# Patient Record
Sex: Female | Born: 1970 | Race: White | Hispanic: No | Marital: Married | Smoking: Former smoker
Health system: Southern US, Community
[De-identification: ages and names within clinical notes are randomized; demographics above are authoritative.]

## PROBLEM LIST (undated history)

## (undated) DIAGNOSIS — R7309 Other abnormal glucose: Secondary | ICD-10-CM

## (undated) DIAGNOSIS — IMO0002 Reserved for concepts with insufficient information to code with codable children: Secondary | ICD-10-CM

## (undated) DIAGNOSIS — M359 Systemic involvement of connective tissue, unspecified: Secondary | ICD-10-CM

## (undated) DIAGNOSIS — M2550 Pain in unspecified joint: Secondary | ICD-10-CM

## (undated) DIAGNOSIS — M62830 Muscle spasm of back: Secondary | ICD-10-CM

## (undated) DIAGNOSIS — F419 Anxiety disorder, unspecified: Secondary | ICD-10-CM

## (undated) DIAGNOSIS — K219 Gastro-esophageal reflux disease without esophagitis: Secondary | ICD-10-CM

## (undated) DIAGNOSIS — K760 Fatty (change of) liver, not elsewhere classified: Secondary | ICD-10-CM

## (undated) DIAGNOSIS — M7072 Other bursitis of hip, left hip: Secondary | ICD-10-CM

## (undated) DIAGNOSIS — F32A Depression, unspecified: Secondary | ICD-10-CM

## (undated) DIAGNOSIS — K449 Diaphragmatic hernia without obstruction or gangrene: Secondary | ICD-10-CM

## (undated) DIAGNOSIS — H40059 Ocular hypertension, unspecified eye: Secondary | ICD-10-CM

## (undated) DIAGNOSIS — M797 Fibromyalgia: Secondary | ICD-10-CM

## (undated) DIAGNOSIS — F329 Major depressive disorder, single episode, unspecified: Secondary | ICD-10-CM

## (undated) DIAGNOSIS — M7071 Other bursitis of hip, right hip: Secondary | ICD-10-CM

## (undated) DIAGNOSIS — L940 Localized scleroderma [morphea]: Secondary | ICD-10-CM

## (undated) DIAGNOSIS — M329 Systemic lupus erythematosus, unspecified: Secondary | ICD-10-CM

## (undated) DIAGNOSIS — J45909 Unspecified asthma, uncomplicated: Secondary | ICD-10-CM

## (undated) DIAGNOSIS — M722 Plantar fascial fibromatosis: Secondary | ICD-10-CM

## (undated) DIAGNOSIS — M3500 Sicca syndrome, unspecified: Secondary | ICD-10-CM

## (undated) DIAGNOSIS — G4730 Sleep apnea, unspecified: Secondary | ICD-10-CM

## (undated) HISTORY — PX: SALPINGECTOMY: SHX328

## (undated) HISTORY — PX: WISDOM TOOTH EXTRACTION: SHX21

## (undated) HISTORY — DX: Ocular hypertension, unspecified eye: H40.059

## (undated) HISTORY — DX: Other bursitis of hip, left hip: M70.72

## (undated) HISTORY — DX: Major depressive disorder, single episode, unspecified: F32.9

## (undated) HISTORY — DX: Other bursitis of hip, right hip: M70.71

## (undated) HISTORY — DX: Diaphragmatic hernia without obstruction or gangrene: K44.9

## (undated) HISTORY — DX: Sjogren syndrome, unspecified: M35.00

## (undated) HISTORY — DX: Depression, unspecified: F32.A

## (undated) HISTORY — DX: Plantar fascial fibromatosis: M72.2

## (undated) HISTORY — DX: Fibromyalgia: M79.7

## (undated) HISTORY — DX: Sleep apnea, unspecified: G47.30

---

## 1992-02-13 HISTORY — PX: APPENDECTOMY: SHX54

## 2001-09-29 ENCOUNTER — Emergency Department (HOSPITAL_COMMUNITY): Admission: EM | Admit: 2001-09-29 | Discharge: 2001-09-29 | Payer: Self-pay | Admitting: Emergency Medicine

## 2001-09-29 ENCOUNTER — Encounter: Payer: Self-pay | Admitting: Emergency Medicine

## 2002-03-17 ENCOUNTER — Encounter: Admission: RE | Admit: 2002-03-17 | Discharge: 2002-03-17 | Payer: Self-pay | Admitting: Emergency Medicine

## 2002-03-17 ENCOUNTER — Encounter: Payer: Self-pay | Admitting: Emergency Medicine

## 2003-06-13 ENCOUNTER — Emergency Department (HOSPITAL_COMMUNITY): Admission: EM | Admit: 2003-06-13 | Discharge: 2003-06-13 | Payer: Self-pay | Admitting: Emergency Medicine

## 2004-06-07 ENCOUNTER — Other Ambulatory Visit: Admission: RE | Admit: 2004-06-07 | Discharge: 2004-06-07 | Payer: Self-pay | Admitting: Gynecology

## 2007-09-01 ENCOUNTER — Emergency Department (HOSPITAL_COMMUNITY): Admission: EM | Admit: 2007-09-01 | Discharge: 2007-09-01 | Payer: Self-pay | Admitting: Emergency Medicine

## 2007-10-30 ENCOUNTER — Emergency Department (HOSPITAL_COMMUNITY): Admission: EM | Admit: 2007-10-30 | Discharge: 2007-10-30 | Payer: Self-pay | Admitting: Emergency Medicine

## 2009-09-21 ENCOUNTER — Encounter (INDEPENDENT_AMBULATORY_CARE_PROVIDER_SITE_OTHER): Payer: Self-pay | Admitting: *Deleted

## 2009-09-21 ENCOUNTER — Ambulatory Visit (HOSPITAL_COMMUNITY): Admission: RE | Admit: 2009-09-21 | Discharge: 2009-09-21 | Payer: Self-pay | Admitting: Family Medicine

## 2009-10-04 ENCOUNTER — Encounter (INDEPENDENT_AMBULATORY_CARE_PROVIDER_SITE_OTHER): Payer: Self-pay | Admitting: *Deleted

## 2009-10-04 ENCOUNTER — Ambulatory Visit: Payer: Self-pay | Admitting: Gastroenterology

## 2009-10-04 DIAGNOSIS — F419 Anxiety disorder, unspecified: Secondary | ICD-10-CM

## 2009-10-04 DIAGNOSIS — R1013 Epigastric pain: Secondary | ICD-10-CM | POA: Insufficient documentation

## 2009-10-04 LAB — CONVERTED CEMR LAB
ALT: 22 units/L (ref 0–35)
BUN: 10 mg/dL (ref 6–23)
Basophils Relative: 1.2 % (ref 0.0–3.0)
CO2: 26 meq/L (ref 19–32)
Chloride: 107 meq/L (ref 96–112)
Creatinine, Ser: 0.7 mg/dL (ref 0.4–1.2)
Eosinophils Absolute: 0.1 10*3/uL (ref 0.0–0.7)
HCT: 41.2 % (ref 36.0–46.0)
Hemoglobin: 14.2 g/dL (ref 12.0–15.0)
IgA: 211 mg/dL (ref 68–378)
Lymphocytes Relative: 22 % (ref 12.0–46.0)
MCHC: 34.5 g/dL (ref 30.0–36.0)
Neutro Abs: 5.2 10*3/uL (ref 1.4–7.7)
RBC: 4.55 M/uL (ref 3.87–5.11)
Sed Rate: 10 mm/hr (ref 0–22)
TSH: 4.06 microintl units/mL (ref 0.35–5.50)
Tissue Transglutaminase Ab, IgA: 13.8 units (ref <20)
Total Protein: 6.8 g/dL (ref 6.0–8.3)
Vitamin B-12: 267 pg/mL (ref 211–911)

## 2009-10-06 ENCOUNTER — Telehealth: Payer: Self-pay | Admitting: Gastroenterology

## 2009-10-26 ENCOUNTER — Encounter: Admission: RE | Admit: 2009-10-26 | Discharge: 2009-10-26 | Payer: Self-pay | Admitting: Gynecology

## 2010-01-10 ENCOUNTER — Emergency Department (HOSPITAL_COMMUNITY)
Admission: EM | Admit: 2010-01-10 | Discharge: 2010-01-11 | Payer: Self-pay | Source: Home / Self Care | Admitting: Emergency Medicine

## 2010-01-31 ENCOUNTER — Encounter
Admission: RE | Admit: 2010-01-31 | Discharge: 2010-01-31 | Payer: Self-pay | Source: Home / Self Care | Attending: Sports Medicine | Admitting: Sports Medicine

## 2010-03-14 NOTE — Assessment & Plan Note (Signed)
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 She had been under extreme amounts of personal stress which seemed to have exacerbated her problems. She also has a globus sensation in her throat and chronic back pain. She had been on PPI therapy without improvement. She denies true dysphagia, emesis, change in bowel habits, melena or hematochezia.  She apparently also has had vague lower, we'll discomfort prompting emergent gynecologic evaluation and ultrasound. Apparently these exams were normal. She is status post bilateral tubal ligations. She also has had appendectomy. Before she became sick 2-3 weeks ago she was using large amount of NSAIDs for various problems. She denies abuse of alcohol or cigarettes.  She's had no hepatobiliary complaints, change in bowel habits, fever, chills, or weight loss. Surprisingly her abdominal pain is not related to eating. Her pain is described as a stabbing pain which lasted a few minutes in duration and does not awaken her from sleep. She denies true dyspepsia. She has not had previous endoscopy or barium studies, but recent upper abdominal ultrasound exam was normal.   GI Review of Systems    Reports abdominal pain, acid reflux, bloating, loss of appetite, and  nausea.     Location of  Abdominal pain: generalized.    Denies belching, chest pain, dysphagia with liquids, dysphagia with solids, heartburn, vomiting, vomiting blood, weight loss, and  weight gain.        Denies  anal fissure, black tarry stools, change in bowel habit, constipation, diarrhea, diverticulosis, fecal incontinence, heme positive stool, hemorrhoids, irritable bowel syndrome, jaundice, light color stool, liver problems, rectal bleeding, and  rectal pain. Preventive Screening-Counseling & Management      Drug Use:  no.      Current Medications (verified): 1)  Flonase 50 Mcg/act Susp (Fluticasone Propionate) .... 2 Sprays Q Am 2)  Loratadine 10 Mg Tabs (Loratadine) .... Qd 3)  Proair Hfa 108 (90 Base) Mcg/act Aers (Albuterol Sulfate) .... As Needed 4)  Dexilant 60 Mg Cpdr (Dexlansoprazole) .... Once Daily  Allergies: 1)  ! Codeine 2)  ! Penicillin 3)  ! * Avelox 4)  ! Compazine 5)  ! * Midrin  Past History:  Past medical, surgical, family and social histories (including risk factors) reviewed for relevance to current acute and chronic problems.  Past Medical History: Reviewed history from 10/03/2009 and no changes required. Anxiety Disorder GERD  Past Surgical History: Appendectomy Salpenectomy  Family History: Reviewed history and no changes required. Family History of Diabetes:  Family History of Heart Disease:  Family History of Kidney Disease: No FH of Colon Cancer:  Social History: Reviewed history from 10/03/2009 and no changes required. Patient has never smoked.  Alcohol Use - no Illicit Drug Use - no Occupation: CMA Daily Caffeine Use  Review of Systems       The patient complains of anxiety-new, back pain, confusion, depression-new, fatigue, headaches-new, muscle pains/cramps, and swelling of feet/legs.  The patient denies allergy/sinus, anemia, arthritis/joint pain, blood in urine, breast changes/lumps, change in vision, cough, coughing up blood, fainting, fever, hearing problems, heart murmur, heart rhythm changes,  itching, menstrual pain, night sweats, nosebleeds, pregnancy symptoms, shortness of breath, skin rash, sleeping problems, sore throat, swollen  lymph glands, thirst - excessive , urination - excessive , urination changes/pain, urine leakage, vision changes, and voice change.    Vital Signs:  Patient profile:   40 year old female Height:      66 inches Weight:      179.13 pounds BMI:     29.02 Pulse rate:   72 / minute Pulse rhythm:   regular BP sitting:   102 / 70  (left arm) Cuff size:   regular  Vitals Entered By: June McMurray CMA Duncan Dull) (October 04, 2009 8:28 AM)  Physical Exam  General:  Well developed, well nourished, no acute distress.healthy appearing.   Head:  Normocephalic and atraumatic. Eyes:  PERRLA, no icterus.exam deferred to patient's ophthalmologist.   Neck:  Supple; no masses or thyromegaly. Lungs:  Clear throughout to auscultation. Heart:  Regular rate and rhythm; no murmurs, rubs,  or bruits. Abdomen:  Soft, nontender and nondistended. No masses, hepatosplenomegaly or hernias noted. Normal bowel sounds. Msk:  Symmetrical with no gross deformities. Normal posture. Pulses:  Normal pulses noted. Extremities:  No clubbing, cyanosis, edema or deformities noted. Neurologic:  Alert and  oriented x4;  grossly normal neurologically. Skin:  Intact without significant lesions or rashes. Cervical Nodes:  No significant cervical adenopathy. Psych:  Alert and cooperative. Normal mood and affect.   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 pylori infection. I placed her on Librax t.i.d., continue daily PPI, check labs, and we'll proceed with endoscopic exam.She also is to avoid NSAIDs if possible. Orders: TLB-CBC Platelet - w/Differential (85025-CBCD) TLB-BMP (Basic Metabolic Panel-BMET) (80048-METABOL) TLB-Hepatic/Liver Function Pnl (80076-HEPATIC) TLB-TSH (Thyroid Stimulating Hormone) (84443-TSH) TLB-B12, Serum-Total ONLY (66063-K16) TLB-Ferritin  (82728-FER) TLB-Folic Acid (Folate) (82746-FOL) TLB-IBC Pnl (Iron/FE;Transferrin) (83550-IBC) TLB-CRP-High Sensitivity (C-Reactive Protein) (86140-FCRP) TLB-Sedimentation Rate (ESR) (85652-ESR) TLB-IgA (Immunoglobulin A) (82784-IGA) T-Sprue Panel (Celiac Disease Aby Eval) (83516x3/[address]) EGD (EGD)  Problem # 2:  ANXIETY (ICD-300.00) Assessment: Deteriorated Recent worsening of her anxiety with her grandmother dying and her daughter leaving home. She previously had been on Wellbutrin and may need reinstitution of antidepressants.  Patient Instructions: 1)  Please go to the basement for lab work. 2)  Begin Librax.tid.Marland Kitchenac 3)  You are scheduled for an upper endoscopy. 4)  The medication list was reviewed and reconciled.  All changed / newly prescribed medications were explained.  A complete medication list was provided to the patient / caregiver. 5)  Copy sent to : Dr. Holley Bouche 6)  Continue daily PPI use. Prescriptions: CLIDINIUM-CHLORDIAZEPOXIDE 2.5-5 MG CAPS (CLIDINIUM-CHLORDIAZEPOXIDE) 1 by mouth three times a day  #90 x 3   Entered by:   Ashok Cordia RN   Authorized by:   Mardella Layman MD [facility]   Signed by:   Ashok Cordia RN on 10/04/2009   Method used:   Electronically to        [facility] # [phone]* (retail)       [address]       [place], [place]  [address]       Ph: [phone]       Fax: [phone]   RxID:   [id number]

## 2010-03-14 NOTE — Progress Notes (Signed)
Summary: results request  Phone Note Call from Patient Call back at Work Phone [phone]   Caller: Patient Call For: Dr. Jarold Motto Reason for Call: Talk to Nurse Summary of Call: would like labwork results Initial call taken by: Vallarie Mare,  October 06, 2009 10:10 AM  Follow-up for Phone Call        Pt notified labs normal. Follow-up by: Ashok Cordia RN,  October 06, 2009 12:50 PM

## 2010-03-14 NOTE — Letter (Signed)
Summary: EGD Instructions  [facility]  [address] [place], [place] [address]   Phone: [phone]  Fax: [phone]       Allison Davies    05-05-1970    MRN: [id number]       Procedure Day /Date: Friday, 10/21/09     Arrival Time:  2:30     Procedure Time: 3:30X     Location of Procedure:                    [facility] (4th Floor)    PREPARATION FOR ENDOSCOPY   On 10/21/09 THE DAY OF THE PROCEDURE:  1.   No solid foods, milk or milk products are allowed after midnight the night before your procedure.  2.   Do not drink anything colored red or purple.  Avoid juices with pulp.  No orange juice.  3.  You may drink clear liquids until 1:30 pm, which is 2 hours before your procedure.                                                                                                CLEAR LIQUIDS INCLUDE: Water Jello Ice Popsicles Tea (sugar ok, no milk/cream) Powdered fruit flavored drinks Coffee (sugar ok, no milk/cream) Gatorade Juice: apple, white grape, white cranberry  Lemonade Clear bullion, consomm, broth Carbonated beverages (any kind) Strained chicken noodle soup Hard Candy   MEDICATION INSTRUCTIONS  Unless otherwise instructed, you should take regular prescription medications with a small sip of water as early as possible the morning of your procedure.                   OTHER INSTRUCTIONS  You will need a responsible adult at least 41 years of age to accompany you and drive you home.   This person must remain in the waiting room during your procedure.  Wear loose fitting clothing that is easily removed.  Leave jewelry and other valuables at home.  However, you may wish to bring a book to read or an iPod/MP3 player to listen to music as you wait for your procedure to start.  Remove all body piercing jewelry and leave at home.  Total time from sign-in until discharge is approximately 2-3 hours.  You should go  home directly after your procedure and rest.  You can resume normal activities the day after your procedure.  The day of your procedure you should not:   Drive   Make legal decisions   Operate machinery   Drink alcohol   Return to work  You will receive specific instructions about eating, activities and medications before you leave.    The above instructions have been reviewed and explained to me by   _______________________    I fully understand and can verbalize these instructions _____________________________ Date _________

## 2010-04-25 LAB — URINALYSIS, ROUTINE W REFLEX MICROSCOPIC
Glucose, UA: 100 mg/dL — AB
Nitrite: NEGATIVE
Protein, ur: NEGATIVE mg/dL

## 2010-04-25 LAB — CBC
Hemoglobin: 11.3 g/dL — ABNORMAL LOW (ref 12.0–15.0)
MCHC: 33.3 g/dL (ref 30.0–36.0)
RBC: 3.67 MIL/uL — ABNORMAL LOW (ref 3.87–5.11)

## 2010-04-25 LAB — URINE MICROSCOPIC-ADD ON

## 2010-04-25 LAB — DIFFERENTIAL
Basophils Relative: 0 % (ref 0–1)
Lymphs Abs: 1.1 10*3/uL (ref 0.7–4.0)
Monocytes Relative: 4 % (ref 3–12)
Neutro Abs: 6.5 10*3/uL (ref 1.7–7.7)
Neutrophils Relative %: 81 % — ABNORMAL HIGH (ref 43–77)

## 2010-04-25 LAB — BASIC METABOLIC PANEL
Calcium: 8.3 mg/dL — ABNORMAL LOW (ref 8.4–10.5)
GFR calc Af Amer: >60 mL/min (ref >60)
GFR calc non Af Amer: >60 mL/min (ref >60)
Sodium: 137 mEq/L (ref 135–145)

## 2010-09-08 ENCOUNTER — Other Ambulatory Visit: Payer: Self-pay | Admitting: Gynecology

## 2010-09-08 DIAGNOSIS — R920 Mammographic microcalcification found on diagnostic imaging of breast: Secondary | ICD-10-CM

## 2010-10-23 ENCOUNTER — Ambulatory Visit
Admission: RE | Admit: 2010-10-23 | Discharge: 2010-10-23 | Disposition: A | Discharge status: Home / Self Care | Payer: No Typology Code available for payment source | Source: Ambulatory Visit | Attending: Gynecology | Admitting: Gynecology

## 2010-10-23 DIAGNOSIS — R920 Mammographic microcalcification found on diagnostic imaging of breast: Secondary | ICD-10-CM

## 2010-11-10 LAB — DIFFERENTIAL
Basophils Absolute: 0
Basophils Relative: 1
Eosinophils Absolute: 0.1
Eosinophils Relative: 1
Neutrophils Relative %: 67

## 2010-11-10 LAB — CBC
HCT: 40.2
MCV: 92.6
Platelets: 200
RDW: 13.8

## 2010-11-10 LAB — POCT CARDIAC MARKERS
Myoglobin, poc: 65.6
Operator id: 264031

## 2010-11-10 LAB — POCT I-STAT, CHEM 8
Creatinine, Ser: 0.8
HCT: 42
Hemoglobin: 14.3
Sodium: 142
TCO2: 20

## 2010-11-13 LAB — POCT I-STAT, CHEM 8
Calcium, Ion: 1.23
Glucose, Bld: 64 — ABNORMAL LOW
HCT: 39
Hemoglobin: 13.3
Potassium: 3.6

## 2010-11-13 LAB — CBC
MCHC: 34.1
MCV: 94.1
RBC: 4.18
RDW: 13.3

## 2010-11-13 LAB — DIFFERENTIAL
Basophils Relative: 1
Eosinophils Absolute: 0.1
Monocytes Relative: 5
Neutrophils Relative %: 66

## 2011-03-28 DIAGNOSIS — K760 Fatty (change of) liver, not elsewhere classified: Secondary | ICD-10-CM | POA: Insufficient documentation

## 2011-10-10 ENCOUNTER — Telehealth: Payer: Self-pay

## 2011-10-10 NOTE — Telephone Encounter (Signed)
LMOM to CB. Trying to fill out form for prior auth of Wellbutrin XL. Form and chart are at [employer]. Please verify with pt if she is still taking 150 mg QD?

## 2011-10-11 NOTE — Telephone Encounter (Signed)
I have spoken to her, she does not take this medication and she does not need it precerted, told us to disregard this.

## 2011-10-11 NOTE — Telephone Encounter (Signed)
Pt had tried to CB and LM on nurse VM. Tried to call pt at # she left [phone], and had to LM for CB again.

## 2011-11-27 DIAGNOSIS — R609 Edema, unspecified: Secondary | ICD-10-CM | POA: Insufficient documentation

## 2011-11-27 DIAGNOSIS — M545 Low back pain, unspecified: Secondary | ICD-10-CM | POA: Insufficient documentation

## 2011-12-06 ENCOUNTER — Telehealth: Payer: Self-pay | Admitting: Gastroenterology

## 2011-12-06 NOTE — Telephone Encounter (Signed)
lmom for pt to call back. Pt's EGD was scheduled for 10/04/09; I can find no other notes.

## 2011-12-06 NOTE — Telephone Encounter (Signed)
Pt reports she has Lupus and has been taking Physical Therapy for Lupus. She also has been taking Ibuprofen for the associated joint and muscle pain. She vomited BRB on 12/01/11 and saw her PCP and he stopped the Ibuprofen and stated she needs an EGD. She also reports pain below her breat bone. She was on Omeprazole, but Dr Alois Cliche changed her to Ranitidine bid. Pt will see Willette Cluster, NP on 12/11/11. Received info from [place] and it was given to [facility], CMA.

## 2011-12-10 ENCOUNTER — Ambulatory Visit (INDEPENDENT_AMBULATORY_CARE_PROVIDER_SITE_OTHER): Payer: BC Managed Care – PPO | Admitting: Nurse Practitioner

## 2011-12-10 ENCOUNTER — Telehealth: Payer: Self-pay | Admitting: Gastroenterology

## 2011-12-10 ENCOUNTER — Encounter (HOSPITAL_COMMUNITY): Payer: Self-pay | Admitting: *Deleted

## 2011-12-10 ENCOUNTER — Encounter: Payer: Self-pay | Admitting: Nurse Practitioner

## 2011-12-10 ENCOUNTER — Emergency Department (HOSPITAL_COMMUNITY)
Admission: EM | Admit: 2011-12-10 | Discharge: 2011-12-10 | Disposition: A | Discharge status: Home / Self Care | Payer: BC Managed Care – PPO | Attending: Emergency Medicine | Admitting: Emergency Medicine

## 2011-12-10 VITALS — BP 110/60 | HR 70 | Ht 65.5 in | Wt 187.0 lb

## 2011-12-10 DIAGNOSIS — Z87891 Personal history of nicotine dependence: Secondary | ICD-10-CM | POA: Insufficient documentation

## 2011-12-10 DIAGNOSIS — Z79899 Other long term (current) drug therapy: Secondary | ICD-10-CM | POA: Insufficient documentation

## 2011-12-10 DIAGNOSIS — Z862 Personal history of diseases of the blood and blood-forming organs and certain disorders involving the immune mechanism: Secondary | ICD-10-CM | POA: Insufficient documentation

## 2011-12-10 DIAGNOSIS — Z8719 Personal history of other diseases of the digestive system: Secondary | ICD-10-CM | POA: Insufficient documentation

## 2011-12-10 DIAGNOSIS — K219 Gastro-esophageal reflux disease without esophagitis: Secondary | ICD-10-CM | POA: Insufficient documentation

## 2011-12-10 DIAGNOSIS — R5381 Other malaise: Secondary | ICD-10-CM | POA: Insufficient documentation

## 2011-12-10 DIAGNOSIS — Z8639 Personal history of other endocrine, nutritional and metabolic disease: Secondary | ICD-10-CM | POA: Insufficient documentation

## 2011-12-10 DIAGNOSIS — J45909 Unspecified asthma, uncomplicated: Secondary | ICD-10-CM | POA: Insufficient documentation

## 2011-12-10 DIAGNOSIS — Z872 Personal history of diseases of the skin and subcutaneous tissue: Secondary | ICD-10-CM | POA: Insufficient documentation

## 2011-12-10 DIAGNOSIS — R1013 Epigastric pain: Secondary | ICD-10-CM

## 2011-12-10 DIAGNOSIS — R51 Headache: Secondary | ICD-10-CM | POA: Insufficient documentation

## 2011-12-10 DIAGNOSIS — K920 Hematemesis: Secondary | ICD-10-CM

## 2011-12-10 DIAGNOSIS — M329 Systemic lupus erythematosus, unspecified: Secondary | ICD-10-CM | POA: Insufficient documentation

## 2011-12-10 DIAGNOSIS — Z8739 Personal history of other diseases of the musculoskeletal system and connective tissue: Secondary | ICD-10-CM | POA: Insufficient documentation

## 2011-12-10 DIAGNOSIS — Z9089 Acquired absence of other organs: Secondary | ICD-10-CM | POA: Insufficient documentation

## 2011-12-10 DIAGNOSIS — Z8659 Personal history of other mental and behavioral disorders: Secondary | ICD-10-CM | POA: Insufficient documentation

## 2011-12-10 DIAGNOSIS — R531 Weakness: Secondary | ICD-10-CM

## 2011-12-10 HISTORY — DX: Muscle spasm of back: M62.830

## 2011-12-10 HISTORY — DX: Localized scleroderma (morphea): L94.0

## 2011-12-10 HISTORY — DX: Fatty (change of) liver, not elsewhere classified: K76.0

## 2011-12-10 HISTORY — DX: Unspecified asthma, uncomplicated: J45.909

## 2011-12-10 HISTORY — DX: Other abnormal glucose: R73.09

## 2011-12-10 HISTORY — DX: Reserved for concepts with insufficient information to code with codable children: IMO0002

## 2011-12-10 HISTORY — DX: Systemic lupus erythematosus, unspecified: M32.9

## 2011-12-10 HISTORY — DX: Gastro-esophageal reflux disease without esophagitis: K21.9

## 2011-12-10 HISTORY — DX: Pain in unspecified joint: M25.50

## 2011-12-10 HISTORY — DX: Systemic involvement of connective tissue, unspecified: M35.9

## 2011-12-10 LAB — CBC WITH DIFFERENTIAL/PLATELET
Basophils Absolute: 0.1 10*3/uL (ref 0.0–0.1)
Basophils Relative: 1 % (ref 0–1)
Hemoglobin: 12.2 g/dL (ref 12.0–15.0)
MCHC: 32 g/dL (ref 30.0–36.0)
Neutro Abs: 5.1 10*3/uL (ref 1.7–7.7)
Neutrophils Relative %: 64 % (ref 43–77)
RDW: 15.6 % — ABNORMAL HIGH (ref 11.5–15.5)

## 2011-12-10 LAB — COMPREHENSIVE METABOLIC PANEL
AST: 26 U/L (ref 0–37)
Albumin: 3.8 g/dL (ref 3.5–5.2)
Alkaline Phosphatase: 115 U/L (ref 39–117)
Chloride: 104 mEq/L (ref 96–112)
Potassium: 3.5 mEq/L (ref 3.5–5.1)
Sodium: 137 mEq/L (ref 135–145)
Total Bilirubin: 0.3 mg/dL (ref 0.3–1.2)

## 2011-12-10 LAB — OCCULT BLOOD, POC DEVICE: Fecal Occult Bld: NEGATIVE

## 2011-12-10 MED ORDER — GI COCKTAIL ~~LOC~~
30.0000 mL | Freq: Once | ORAL | Status: AC
  Administered 2011-12-10: 30 mL via ORAL
  Filled 2011-12-10: qty 30

## 2011-12-10 MED ORDER — ESOMEPRAZOLE MAGNESIUM 40 MG PO CPDR: 40.0000 mg | DELAYED_RELEASE_CAPSULE | Freq: Every day | ORAL | Status: DC

## 2011-12-10 MED ORDER — SODIUM CHLORIDE 0.9 % IV BOLUS (SEPSIS)
1000.0000 mL | Freq: Once | INTRAVENOUS | Status: AC
  Administered 2011-12-10: 1000 mL via INTRAVENOUS

## 2011-12-10 NOTE — ED Provider Notes (Signed)
History     CSN: [id number]  Arrival date & time 12/10/11  0301   First MD Initiated Contact with Patient 12/10/11 0406      Chief Complaint  Patient presents with  . Emesis    (Consider location/radiation/quality/duration/timing/severity/associated sxs/prior treatment) HPI Comments: 41 y/o female with PMHx of GERD presents to the ED complaining of epigastric pain, nausea and vomiting since last Sunday. She began vomiting bright red blood with acid. Saw her PCP on Tuesday who prescribed her ranitidine and wanted her to follow up with GI for an EGD to r/o Mallory-Weiss tear. She has an appointment tomorrow with GI Dr. Jarold Motto, however she was told to go to ER if she began feeling very weak and lightheaded. Today she began feeling "extremely weak", lightheaded and dizzy. Denies abdominal pain at this time. No history of GI bleeds or hematemesis. Non smoker and denies any alcohol use.  Patient is a 41 y.o. female presenting with vomiting. The history is provided by the patient.  Emesis  Associated symptoms include abdominal pain (subsided) and headaches. Pertinent negatives include no chills and no fever.    Past Medical History  Diagnosis Date  . GERD (gastroesophageal reflux disease)   . Lupus   . Morphea   . Joint pain   . Connective tissue disease   . Asthma   . Fatty liver   . Blood glucose abnormal   . Muscle spasm of back   . Headache   . Night terrors, adult     Past Surgical History  Procedure Date  . Appendectomy   . Salpingectomy     No family history on file.  History  Substance Use Topics  . Smoking status: Former Smoker    Quit date: 02/29/2004  . Smokeless tobacco: Not on file  . Alcohol Use: No    OB History    Grav Para Term Preterm Abortions TAB SAB Ect Mult Living                  Review of Systems  Constitutional: Positive for appetite change and fatigue. Negative for fever and chills.  HENT: Negative for neck pain and neck stiffness.     Eyes: Negative for visual disturbance.  Respiratory: Negative for shortness of breath.   Cardiovascular: Negative for chest pain.  Gastrointestinal: Positive for nausea, vomiting and abdominal pain (subsided).  Genitourinary: Negative for dysuria, difficulty urinating and pelvic pain.  Musculoskeletal: Negative for back pain.  Skin: Negative for pallor.  Neurological: Positive for weakness, light-headedness and headaches.  Psychiatric/Behavioral: Negative for confusion.    Allergies  Lemon extract; Codeine; Prochlorperazine edisylate; Promethazine; Ativan; Hydrocodone; Isometheptene-apap-dichloral; Moxifloxacin; Penicillins; Plaquenil; and Xanax  Home Medications   Current Outpatient Rx  Name Route Sig Dispense Refill  . RANITIDINE HCL 150 MG PO TABS Oral Take 150 mg by mouth 2 (two) times daily.      BP 112/75  Pulse 82  Temp 98.1 F (36.7 C) (Oral)  Resp 18  Ht 5\' 6"  (1.676 m)  Wt 183 lb (83.008 kg)  BMI 29.54 kg/m2  SpO2 98%  LMP 11/22/2011  Physical Exam  Nursing note and vitals reviewed. Constitutional: She is oriented to person, place, and time. She appears well-developed and well-nourished. No distress.       Appears fatigued  HENT:  Head: Normocephalic and atraumatic.  Mouth/Throat: Oropharynx is clear and moist.  Eyes: Conjunctivae normal and EOM are normal.  Neck: Normal range of motion. Neck supple.  Cardiovascular: Normal rate,  regular rhythm, normal heart sounds and intact distal pulses.   Pulmonary/Chest: Effort normal and breath sounds normal.  Abdominal: Soft. Normal appearance and bowel sounds are normal. She exhibits no mass. There is tenderness in the epigastric area. There is no rigidity, no rebound and no guarding.  Genitourinary: Rectal exam shows internal hemorrhoid. Rectal exam shows no external hemorrhoid, no mass and no tenderness. Guaiac negative stool.       No blood seen on rectal.  Musculoskeletal: Normal range of motion.  Neurological:  She is alert and oriented to person, place, and time.  Skin: Skin is warm and dry.  Psychiatric: She has a normal mood and affect. Her speech is normal and behavior is normal.    ED Course  Procedures (including critical care time)  Labs Reviewed  CBC WITH DIFFERENTIAL - Abnormal; Notable for the following:    RDW 15.6 (*)     All other components within normal limits  COMPREHENSIVE METABOLIC PANEL - Abnormal; Notable for the following:    Glucose, Bld 123 (*)     GFR calc non Af Amer 90 (*)     All other components within normal limits  LIPASE, BLOOD  OCCULT BLOOD, POC DEVICE   Date: 12/10/2011  Rate: 75  Rhythm: normal sinus rhythm  QRS Axis: normal  Intervals: QT prolonged borderline  ST/T Wave abnormalities: normal  Conduction Disutrbances:none  Narrative Interpretation: no stemi  Old EKG Reviewed: none available   No results found.   1. Hematemesis   2. Weakness       MDM  41 y/o female with hematemesis and weakness. CBC, CMP, lipase all WNL. IV fluids given. Checking stool for occult blood and obtaining EKG to r/o any cardiac causes of weakness. She has an appointment tomorrow with GI. If occult blood negative, will give GI cocktail and have her f/u with GI as scheduled. Case discussed with Dr. Oletta Lamas who agrees with plan of care. 5:37 AM Patient states no improvement with fluids and GI cocktail. EKG without any acute finding. Hemoccult negative. Vitals are stable. Patient being discharged and explained importance of GI f/u which she has scheduled for tomorrow.       Trevor Mace, PA-C 12/10/11 [phone]

## 2011-12-10 NOTE — Progress Notes (Signed)
12/10/2011 Allison Davies [id number] 07-15-1970   History of Present Illness:  Patient is a 41 year old female who was seen by Dr. Jarold Motto in August 2011 for evaluation of epigastric pain, nausea, gas and bloating. Her symptoms were felt to be functional in nature but she was tried on Librax, advised to discontinue NSAIDs and continue daily PPI. Plan was for EGD if symptoms didn't improve. Her CBC and LFTs were normal.   Patient has returned with recurrent epigastric pain and vomiting (acid) last Sunday. Emesis eventually contained bright red blood. No vomiting in last several days but she  tastes blood in throat. The epigastric pain radiates through to her back, it is worse with meals. She was taking Ibuprofen for joint pain but stopped it one week ago.  Her PCP recently replaced Prilosec with Zantac BID to see if that would help her symptoms. Patient went to ED this am with weakness / dizziness / nausea and vomiting. CBC was normal, hemoglobin 12.2. BUN normal, LFTs and lipase were normal. She was given GI cocktail and told to see [place].    Patient gives a history of lupus. I reviewed numerous PCP records faxed to our office. 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 Labs from PCP dated 10/22 reveal normal LFTs. WBC normal. Hemoglobin 11.5, MCV 82. She had an ultrasound Feb 2013 with findings of hepatomegaly and steatosis. No evidence of gallbladder disease.  A KUB in April 2013 revealed moderate gas distention of stomach and moderate stool burden. I have seen all records for scanning.   Current Medications, Allergies, Past Medical History, Past Surgical History, Family History and Social History were reviewed in [employer] record.   Physical Exam: General: Well developed , white female in no acute distress Head: Normocephalic and atraumatic Eyes:  sclerae anicteric, conjunctiva pink  Ears: Normal auditory  acuity Lungs: Clear throughout to auscultation Heart: Regular rate and rhythm Abdomen: Soft, mild epigastric tenderness.  No masses, no hepatomegaly. Normal bowel sounds Rectal: brown stool in vault, heme negative Musculoskeletal: Symmetrical with no gross deformities  Extremities: No edema  Neurological: Alert oriented x 4, grossly nonfocal Psychological:  Alert, flat affect  Assessment and Recommendations: 1. Recurrent epigastric pain, nausea, vomiting with hematemesis (several days ago). Labs, including hemoglobin and BUN were normal in ED yesterday. Symptoms in setting of chronic PPI but also NSAID use. No evidence for active bleeding. Suspect symptoms are functional with hematemesis secondary to mallory weiss tear or erosive disease. For further evaluation patient will be scheduled for EGD with propofol. The benefits, risks, and potential complications of EGD with possible biopsies were discussed with the patient and she agrees to proceed. In the meantime recommended she avoid NSAIDS until time of EGD. Discontinue Zantac, begin BID Nexium.    2. Cutaneous Lupus, currently untreated.    3. Multiple drug allergies.

## 2011-12-10 NOTE — Patient Instructions (Addendum)
You have been scheduled for an endoscopy with propofol. Please follow written instructions given to you at your visit today. If you use inhalers (even only as needed) or a CPAP machine, please bring them with you on the day of your procedure.  Take Nexium twice daily.  Take no Ibuprofen, Aleve, any Non Inflammatory medicines until after the procedure. We have given you a work note for today and for Wednesday, the day of the procedure.

## 2011-12-10 NOTE — Telephone Encounter (Signed)
Pt reports she spit up blood again this am; BRB mixed with saliva. Spoke with Dr Jarold Motto to see if pt needs doing today and d/t her amount of blood, he feels she needs to start Nexium BID and see Willette Cluster, NP this afternoon. I will leave Nexium samples at the front desk; she may or may not come prior to her appt.

## 2011-12-10 NOTE — ED Provider Notes (Signed)
Medical screening examination/treatment/procedure(s) were performed by non-physician practitioner and as supervising physician I was immediately available for consultation/collaboration.   Gavin Pound. Oletta Lamas, MD 12/10/11 [phone]

## 2011-12-10 NOTE — ED Notes (Signed)
Pt reports has had "severe acid reflux" last Sunday that progressed to N/V; blood noted to vomit on Monday; saw PCP on Tuesday; MD prescribed Rantidine twice daily and requests to follow up with GI for EGD; has not seen GI or had EGD as of yet; pt reports that woke this am with dizziness, feeling weak and had blood in the emesis again; pt states that PCP advised to come to ER for evaluation.

## 2011-12-10 NOTE — ED Notes (Signed)
Pt complains of nausea and vomiting since Sunday, has been to her Dr and has a GI appt on Tuesday, but was told to come here if she's not any better before then.

## 2011-12-11 ENCOUNTER — Encounter: Payer: Self-pay | Admitting: Gastroenterology

## 2011-12-11 ENCOUNTER — Ambulatory Visit: Payer: No Typology Code available for payment source | Admitting: Nurse Practitioner

## 2011-12-12 ENCOUNTER — Ambulatory Visit (AMBULATORY_SURGERY_CENTER): Payer: BC Managed Care – PPO | Admitting: Gastroenterology

## 2011-12-12 ENCOUNTER — Encounter: Payer: Self-pay | Admitting: Gastroenterology

## 2011-12-12 VITALS — BP 110/75 | HR 68 | Temp 99.0°F | Resp 13 | Ht 65.0 in | Wt 187.0 lb

## 2011-12-12 DIAGNOSIS — K920 Hematemesis: Secondary | ICD-10-CM

## 2011-12-12 DIAGNOSIS — K219 Gastro-esophageal reflux disease without esophagitis: Secondary | ICD-10-CM

## 2011-12-12 DIAGNOSIS — R1013 Epigastric pain: Secondary | ICD-10-CM

## 2011-12-12 DIAGNOSIS — K209 Esophagitis, unspecified: Secondary | ICD-10-CM

## 2011-12-12 MED ORDER — ESOMEPRAZOLE MAGNESIUM 40 MG PO CPDR: 40.0000 mg | DELAYED_RELEASE_CAPSULE | Freq: Two times a day (BID) | ORAL | Status: DC

## 2011-12-12 MED ORDER — SODIUM CHLORIDE 0.9 % IV SOLN: 500.0000 mL | INTRAVENOUS | Status: DC

## 2011-12-12 NOTE — Patient Instructions (Addendum)
Continue taking nexium 40 mg 2 x per day.  Rx was sent to [facility] on [address].  You may resume your other current medications today.  Avoid NSAIDS for two weeks per Dr. Jarold Motto.   A handout was given to your care partner on GERD and hiatal hernia.  Please call if any questions or concerns.    YOU HAD AN ENDOSCOPIC PROCEDURE TODAY AT THE [facility]: Refer to the procedure report that was given to you for any specific questions about what was found during the examination.  If the procedure report does not answer your questions, please call your gastroenterologist to clarify.  If you requested that your care partner not be given the details of your procedure findings, then the procedure report has been included in a sealed envelope for you to review at your convenience later.  YOU SHOULD EXPECT: Some feelings of bloating in the abdomen. Passage of more gas than usual.  Walking can help get rid of the air that was put into your GI tract during the procedure and reduce the bloating. If you had a lower endoscopy (such as a colonoscopy or flexible sigmoidoscopy) you may notice spotting of blood in your stool or on the toilet paper. If you underwent a bowel prep for your procedure, then you may not have a normal bowel movement for a few days.  DIET: Your first meal following the procedure should be a light meal and then it is ok to progress to your normal diet.  A half-sandwich or bowl of soup is an example of a good first meal.  Heavy or fried foods are harder to digest and may make you feel nauseous or bloated.  Likewise meals heavy in dairy and vegetables can cause extra gas to form and this can also increase the bloating.  Drink plenty of fluids but you should avoid alcoholic beverages for 24 hours.  ACTIVITY: Your care partner should take you home directly after the procedure.  You should plan to take it easy, moving slowly for the rest of the day.  You can resume normal activity the day  after the procedure however you should NOT DRIVE or use heavy machinery for 24 hours (because of the sedation medicines used during the test).    SYMPTOMS TO REPORT IMMEDIATELY: A gastroenterologist can be reached at any hour.  During normal business hours, 8:30 AM to 5:00 PM Monday through Friday, call [phone].  After hours and on weekends, please call the GI answering service at [phone] who will take a message and have the physician on call contact you.     Following upper endoscopy (EGD)  Vomiting of blood or coffee ground material  New chest pain or pain under the shoulder blades  Painful or persistently difficult swallowing  New shortness of breath  Fever of 100F or higher  Black, tarry-looking stools  FOLLOW UP: If any biopsies were taken you will be contacted by phone or by letter within the next 1-3 weeks.  Call your gastroenterologist if you have not heard about the biopsies in 3 weeks.  Our staff will call the home number listed on your records the next business day following your procedure to check on you and address any questions or concerns that you may have at that time regarding the information given to you following your procedure. This is a courtesy call and so if there is no answer at the home number and we have not heard from you through the  emergency physician on call, we will assume that you have returned to your regular daily activities without incident.  SIGNATURES/CONFIDENTIALITY: You and/or your care partner have signed paperwork which will be entered into your electronic medical record.  These signatures attest to the fact that that the information above on your After Visit Summary has been reviewed and is understood.  Full responsibility of the confidentiality of this discharge information lies with you and/or your care-partner.

## 2011-12-12 NOTE — Progress Notes (Signed)
VSS-PACU uneventul anesthesia.AXO-3.NAC noted.

## 2011-12-12 NOTE — Op Note (Signed)
[facility] [address] [place], [address]   ENDOSCOPY PROCEDURE REPORT  PATIENT: Allison Davies, Allison Davies  MR#: [id number] BIRTHDATE: 1970-10-08 , 41  yrs. old GENDER: Female ENDOSCOPIST:David Hale Bogus, MD, [facility] REFERRED BY: PROCEDURE DATE:  12/12/2011 PROCEDURE:   EGD w/ biopsy for H.pylori and EGD w/ biopsy ASA CLASS:    Class II INDICATIONS: epigastric pain, hematemesis, and nausea. MEDICATION: propofol (Diprivan) 250mg  IV TOPICAL ANESTHETIC:  DESCRIPTION OF PROCEDURE:   After the risks and benefits of the procedure were explained, informed consent was obtained.  [facility] [id number]  endoscope was introduced through the mouth  and advanced to the second portion of the duodenum .  The instrument was slowly withdrawn as the mucosa was fully examined.    The upper, middle and distal third of the esophagus were carefully inspected and no abnormalities were noted.  The z-line was well seen at the GEJ.  The endoscope was pushed into the fundus which was normal including a retroflexed view.  The antrum, gastric body, first and second part of the duodenum were unremarkable.   Moderate sizef hiatial hernia noted.   Irregular Z-line biopsied. Irregular Z-line biopsied.   CLO bx.  done.    Retroflexed views revealed a hiatal hernia.    The scope was then withdrawn from the patient and the procedure completed.  COMPLICATIONS: There were no complications.   ENDOSCOPIC IMPRESSION: 1.   Normal EGD...chronic gerd,no bleeding noted. 2.   Moderate sizef hiatial hernia noted. 3.   Irregular Z-line biopsied.. r/o Barrett's mucosa. Marland Kitchen  RECOMMENDATIONS: 1.  continue current medications 2.  Avoid NSAIDS for two weeks 3.  await pathology results    _______________________________ eSigned:  Mardella Layman, MD, [facility] 12/12/2011 10:44 AM   standard discharge   PATIENT NAME:  Allison Davies, Allison Davies MR#: [id number]

## 2011-12-12 NOTE — Progress Notes (Signed)
No complaints noted on discharge. Maw  Patient did not experience any of the following events: a burn prior to discharge; a fall within the facility; wrong site/side/patient/procedure/implant event; or a hospital transfer or hospital admission upon discharge from the facility. [phone]) Patient did not have preoperative order for IV antibiotic SSI prophylaxis. [phone])

## 2011-12-13 ENCOUNTER — Telehealth: Payer: Self-pay

## 2011-12-13 NOTE — Telephone Encounter (Signed)
Left message on answering machine. 

## 2011-12-18 ENCOUNTER — Encounter: Payer: Self-pay | Admitting: Gastroenterology

## 2011-12-18 ENCOUNTER — Telehealth: Payer: Self-pay | Admitting: Gastroenterology

## 2011-12-18 DIAGNOSIS — K219 Gastro-esophageal reflux disease without esophagitis: Secondary | ICD-10-CM

## 2011-12-18 MED ORDER — ESOMEPRAZOLE MAGNESIUM 40 MG PO CPDR: 40.0000 mg | DELAYED_RELEASE_CAPSULE | Freq: Two times a day (BID) | ORAL | Status: DC

## 2011-12-18 MED ORDER — PANTOPRAZOLE SODIUM 40 MG PO TBEC: 40.0000 mg | DELAYED_RELEASE_TABLET | Freq: Two times a day (BID) | ORAL | Status: DC

## 2011-12-18 NOTE — Telephone Encounter (Signed)
Pt has failed Zantac and Omeprazole; states she feels better on the Nexium than she has in a long time, but the Nexium is over $200 and she can't afford that. Called [facility] for [employer] prior auth and d/t BID Nexium, the cost remains high; if she uses mail order, she can get a 90day supply for $380. The rep reports pantoprazole is much cheaper. Ordered Pantoprazole and left message for pt to call back.

## 2011-12-18 NOTE — Telephone Encounter (Signed)
Pt in for samples of Nexium and I asked her what insurance company she orders her meds from because I checked with [employer]. She reports she uses [employer] for her meds. I spent > 30 minutes trying to see if Nexium could be the preferred drug of choice and be cheaper for the pt. I was snt to [facility] and pt is not registered.after 3 attempts, I was told pt needs to call her Plan Benefits Of[employer] to register.  lmom informing pt.

## 2011-12-24 ENCOUNTER — Other Ambulatory Visit: Payer: Self-pay | Admitting: *Deleted

## 2011-12-24 MED ORDER — PANTOPRAZOLE SODIUM 40 MG PO TBEC: 40.0000 mg | DELAYED_RELEASE_TABLET | Freq: Two times a day (BID) | ORAL | Status: DC

## 2011-12-24 NOTE — Telephone Encounter (Signed)
Received patient mail order form ([facility]) for patient's Pantoprazole. Canceled refills at [facility] because patient already picked up RX for month's worth. Pantoprazole RX sent to [facility].  I discontinued patients Zantac per Gunnar Fusi notes. Discontinue Zantac, begin BID Nexium. Nexium was given only as samples.

## 2011-12-31 ENCOUNTER — Telehealth: Payer: Self-pay | Admitting: Gastroenterology

## 2011-12-31 NOTE — Telephone Encounter (Signed)
Pt called for results and stated understanding, but she is confused because she still has symptoms. Advised her to f/u with dr Jarold Motto so he may discuss this with her. Pt will f/u 01/15/12.

## 2012-01-06 ENCOUNTER — Telehealth: Payer: Self-pay | Admitting: Gastroenterology

## 2012-01-06 NOTE — Telephone Encounter (Signed)
C/o nausea and vomiting for past 6 hours.  Some diarrhea, no abdominal pain.  Advised to contact PCP Dr. Bufford Spikes in am if not improved.

## 2012-01-07 ENCOUNTER — Encounter: Payer: Self-pay | Admitting: *Deleted

## 2012-01-08 DIAGNOSIS — R209 Unspecified disturbances of skin sensation: Secondary | ICD-10-CM | POA: Insufficient documentation

## 2012-01-08 DIAGNOSIS — R42 Dizziness and giddiness: Secondary | ICD-10-CM | POA: Insufficient documentation

## 2012-01-15 ENCOUNTER — Ambulatory Visit (INDEPENDENT_AMBULATORY_CARE_PROVIDER_SITE_OTHER): Payer: BC Managed Care – PPO | Admitting: Gastroenterology

## 2012-01-15 ENCOUNTER — Encounter: Payer: Self-pay | Admitting: Gastroenterology

## 2012-01-15 VITALS — BP 100/60 | HR 80 | Ht 65.0 in | Wt 187.1 lb

## 2012-01-15 DIAGNOSIS — R110 Nausea: Secondary | ICD-10-CM

## 2012-01-15 DIAGNOSIS — F458 Other somatoform disorders: Secondary | ICD-10-CM

## 2012-01-15 DIAGNOSIS — R1310 Dysphagia, unspecified: Secondary | ICD-10-CM

## 2012-01-15 DIAGNOSIS — K219 Gastro-esophageal reflux disease without esophagitis: Secondary | ICD-10-CM

## 2012-01-15 DIAGNOSIS — R0989 Other specified symptoms and signs involving the circulatory and respiratory systems: Secondary | ICD-10-CM

## 2012-01-15 MED ORDER — SUCRALFATE 1 GM/10ML PO SUSP: 1.0000 g | Freq: Four times a day (QID) | ORAL | Status: DC

## 2012-01-15 NOTE — Progress Notes (Signed)
This is  a 41 year old Caucasian female who continues with a globus sensation in her throat.  She had recent endoscopy which was unremarkable, and biopsies for H. pylori and Barrett's mucosa were negative.  She currently is on twice a day Protonix and continues with acid reflux symptoms, mostly globus sensation in her throat.  She denies other GI or general medical problems,and noted are multiple drug allergies.  Current Medications, Allergies, Past Medical History, Past Surgical History, Family History and Social History were reviewed in [employer] record.  Pertinent Review of Systems Negative    PE: healthy-appearing patient in no distress.  Blood pressure 100/60, pulse 80 and regular, and weight 187 pounds the BMI of 31.14.  Examination oropharynx and neck areas unremarkable.  Chest is clear and she is in a regular rhythm without murmurs gallops or rubs.  Abdominal exam is unremarkable.  Peripheral extremities are unremarkable, mental status is normal.    Assessment and Plan: Globus sensation in a patient with probable GERD.  I have ordered a barium swallow and also high-resolution esophageal manometry complete her workup.  We will continue twice a day PPI therapy with a trial of PC and each bedtime liquid Carafate.  I will see her in followup as needed.  Standard reflux regime again reviewed with patient. Encounter Diagnoses  Name Primary?  Marland Kitchen Dysphagia Yes  . Nausea

## 2012-01-15 NOTE — Patient Instructions (Addendum)
You have been scheduled for a Barium Esophogram at [facility] (1st floor of the hospital) on 01-18-2012 at 9:30 AM. Please arrive 15 minutes prior to your appointment for registration. Make certain not to have anything to eat or drink 6 hours prior to your test. If you need to reschedule for any reason, please contact radiology at [phone] to do so. __________________________________________________________________ A barium swallow is an examination that concentrates on views of the esophagus. This tends to be a double contrast exam (barium and two liquids which, when combined, create a gas to distend the wall of the oesophagus) or single contrast (non-ionic iodine based). The study is usually tailored to your symptoms so a good history is essential. Attention is paid during the study to the form, structure and configuration of the esophagus, looking for functional disorders (such as aspiration, dysphagia, achalasia, motility and reflux) EXAMINATION You may be asked to change into a gown, depending on the type of swallow being performed. A radiologist and radiographer will perform the procedure. The radiologist will advise you of the type of contrast selected for your procedure and direct you during the exam. You will be asked to stand, sit or lie in several different positions and to hold a small amount of fluid in your mouth before being asked to swallow while the imaging is performed .In some instances you may be asked to swallow barium coated marshmallows to assess the motility of a solid food bolus. The exam can be recorded as a digital or video fluoroscopy procedure. POST PROCEDURE It will take 1-2 days for the barium to pass through your system. To facilitate this, it is important, unless otherwise directed, to increase your fluids for the next 24-48hrs and to resume your normal diet.  This test typically takes about 30 minutes to  perform. __________________________________________________________________________________    Allison Davies have been scheduled for an esophageal manometry at [facility] on 03-03-2012 at 9 AM. Please arrive 30 minutes prior to your procedure for registration. You will need to go to outpatient registration (1st floor of the hospital) first. Make certain to bring your insurance cards as well as a complete list of medications.  Please remember the following:  1) Nothing to eat or drink after 12:00 midnight on the night before your test.  2) Hold all diabetic medications/insulin the morning of the test. You may eat and take your medications after the test.  3) For 3 days prior to your test do not take: Dexilant, Prevacid, Nexium, Protonix, Aciphex, Zegerid, Pantoprazole, Prilosec or omeprazole.  4) For 2 days prior to your test, do not take: Reglan, Tagamet, Zantac, Axid or Pepcid.  5) You MAY use an antacid such as Rolaids or Tums up to 12 hours prior to your test.  It will take at least 2 weeks to receive the results of this test from your physician. ------------------------------------------ ABOUT ESOPHAGEAL MANOMETRY Esophageal manometry (muh-NOM-uh-tree) is a test that gauges how well your esophagus works. Your esophagus is the long, muscular tube that connects your throat to your stomach. Esophageal manometry measures the rhythmic muscle contractions (peristalsis) that occur in your esophagus when you swallow. Esophageal manometry also measures the coordination and force exerted by the muscles of your esophagus.  During esophageal manometry, a thin, flexible tube (catheter) that contains sensors is passed through your nose, down your esophagus and into your stomach. Esophageal manometry can be helpful in diagnosing some mostly uncommon disorders that affect your esophagus.  Why it's done Esophageal manometry is  used to evaluate the movement (motility) of food through the esophagus and  into the stomach. The test measures how well the circular bands of muscle (sphincters) at the top and bottom of your esophagus open and close, as well as the pressure, strength and pattern of the wave of esophageal muscle contractions that moves food along.  What you can expect Esophageal manometry is an outpatient procedure done without sedation. Most people tolerate it well. You may be asked to change into a hospital gown before the test starts.  During esophageal manometry  While you are sitting up, a member of your health care team sprays your throat with a numbing medication or puts numbing gel in your nose or both.  A catheter is guided through your nose into your esophagus. The catheter may be sheathed in a water-filled sleeve. It doesn't interfere with your breathing. However, your eyes may water, and you may gag. You may have a slight nosebleed from irritation.  After the catheter is in place, you may be asked to lie on your back on an exam table, or you may be asked to remain seated.  You then swallow small sips of water. As you do, a computer connected to the catheter records the pressure, strength and pattern of your esophageal muscle contractions.  During the test, you'll be asked to breathe slowly and smoothly, remain as still as possible, and swallow only when you're asked to do so.  A member of your health care team may move the catheter down into your stomach while the catheter continues its measurements.  The catheter then is slowly withdrawn. The test usually lasts 20 to 30 minutes.  After esophageal manometry  When your esophageal manometry is complete, you may return to your normal activities  This test typically takes 30-45 minutes to complete. ________________________________________________________________________________   We have sent the following medications to your pharmacy for you to pick up at your convenience: Carafate. Please take as directed

## 2012-01-18 ENCOUNTER — Other Ambulatory Visit (HOSPITAL_COMMUNITY): Payer: BC Managed Care – PPO

## 2012-01-29 ENCOUNTER — Other Ambulatory Visit (HOSPITAL_COMMUNITY): Payer: BC Managed Care – PPO

## 2012-02-04 ENCOUNTER — Ambulatory Visit (INDEPENDENT_AMBULATORY_CARE_PROVIDER_SITE_OTHER): Payer: BC Managed Care – PPO | Admitting: Emergency Medicine

## 2012-02-04 VITALS — BP 123/77 | HR 98 | Temp 99.1°F | Resp 16 | Ht 66.0 in

## 2012-02-04 DIAGNOSIS — R05 Cough: Secondary | ICD-10-CM

## 2012-02-04 DIAGNOSIS — J111 Influenza due to unidentified influenza virus with other respiratory manifestations: Secondary | ICD-10-CM

## 2012-02-04 LAB — POCT INFLUENZA A/B: Influenza B, POC: NEGATIVE

## 2012-02-04 MED ORDER — OSELTAMIVIR PHOSPHATE 75 MG PO CAPS: 75.0000 mg | ORAL_CAPSULE | Freq: Every day | ORAL | Status: DC

## 2012-02-04 NOTE — Progress Notes (Signed)
[facility] [address], [place] [address] [phone]  Date:  02/04/2012   Name:  Allison Davies   DOB:  Apr 23, 1970   MRN:  [id number]  PCP:  Darnelle Going, MD    Chief Complaint: Cough   History of Present Illness:  Allison Davies is a 41 y.o. very pleasant female patient who presents with the following:  Ill since Saturday with fever, chills, malaise.  Has a cough.  Scant purulent sputum.  Nasal congestion and post nasal drainage. No wheezing or shortness of breath.  No improvement with OTC medications.   Says throat is very sore.    Patient Active Problem List  Diagnosis  . ANXIETY  . ABDOMINAL PAIN, EPIGASTRIC  . Hematemesis    Past Medical History  Diagnosis Date  . GERD (gastroesophageal reflux disease)   . Lupus   . Morphea   . Joint pain   . Connective tissue disease   . Asthma   . Fatty liver   . Blood glucose abnormal   . Muscle spasm of back   . Headache   . Night terrors, adult   . Depression   . Hiatal hernia     Past Surgical History  Procedure Date  . Appendectomy   . Salpingectomy     bi-lateral    History  Substance Use Topics  . Smoking status: Former Smoker    Quit date: 02/29/2004  . Smokeless tobacco: Never Used  . Alcohol Use: Yes     Comment: only on vacation x 1 drink    Family History  Problem Relation Age of Onset  . Colon cancer Neg Hx   . Diabetes Mother   . Diabetes Father   . Diabetes Maternal Grandfather   . Diabetes Maternal Grandmother   . Heart disease Brother   . Heart disease Mother   . Heart disease Maternal Grandfather   . Hypertension Brother   . Depression Mother   . Depression Father   . Heart disease Maternal Grandmother   . Stroke Brother     Allergies  Allergen Reactions  . Lemon Extract (Flavoring Agent) Anaphylaxis  . Prochlorperazine Edisylate Anaphylaxis  . Codeine Nausea And Vomiting    severe  . Promethazine Swelling  . Ativan (Lorazepam) Palpitations  .  Hydrocodone Palpitations    Patient states that she can not take narcotics. Tylenol is the only pain medication she can take.  . Isometheptene-Apap-Dichloral Palpitations  . Klonopin (Clonazepam) Palpitations  . Moxifloxacin Rash  . Penicillins Hives and Rash  . Plaquenil (Hydroxychloroquine) Palpitations  . Xanax (Alprazolam) Palpitations    Medication list has been reviewed and updated.  Current Outpatient Prescriptions on File Prior to Visit  Medication Sig Dispense Refill  . acetaminophen (TYLENOL) 500 MG tablet Take 1,000 mg by mouth every 6 (six) hours as needed.      Marland Kitchen albuterol (PROVENTIL HFA;VENTOLIN HFA) 108 (90 BASE) MCG/ACT inhaler Inhale 2 puffs into the lungs every 6 (six) hours as needed.      . fluticasone (FLONASE) 50 MCG/ACT nasal spray Place 2 sprays into the nose as needed.      . Loratadine 10 MG CAPS Take by mouth as needed.      Marland Kitchen OVER THE COUNTER MEDICATION Two old Goats, pt mail orders, uses lotion as needed for joint pain      . OVER THE COUNTER MEDICATION Neutrogena  Ultra Sheer sunblock , uses everyday due to her Lupus      .  pantoprazole (PROTONIX) 40 MG tablet Take 1 tablet (40 mg total) by mouth 2 (two) times daily.  90 tablet  6  . sucralfate (CARAFATE) 1 GM/10ML suspension Take 10 mLs (1 g total) by mouth 4 (four) times daily.  420 mL  1  . calcium carbonate (TUMS EX) 750 MG chewable tablet Chew 1 tablet by mouth 2 (two) times daily.      . Meclizine HCl (BONINE PO) Take by mouth as needed. Dizzy spells      . Menthol, Topical Analgesic, (ICY HOT MEDICATED SPRAY) 16 % LIQD Apply topically as needed.        Review of Systems:  As per HPI, otherwise negative.    Physical Examination: Filed Vitals:   02/04/12 0927  BP: 123/77  Pulse: 98  Temp: 99.1 F (37.3 C)  Resp: 16   Filed Vitals:   02/04/12 0927  Height: 5\' 6"  (1.676 m)   There is no weight on file to calculate BMI. Ideal Body Weight: Weight in (lb) to have BMI = 25: 154.6   GEN:  WDWN, NAD, Non-toxic, A & O x 3 HEENT: Atraumatic, Normocephalic. Neck supple. No masses, No LAD. Ears and Nose: No external deformity. CV: RRR, No M/G/R. No JVD. No thrill. No extra heart sounds. PULM: CTA B, no wheezes, crackles, rhonchi. No retractions. No resp. distress. No accessory muscle use. ABD: S, NT, ND, +BS. No rebound. No HSM. EXTR: No c/c/e NEURO Normal gait.  PSYCH: Normally interactive. Conversant. Not depressed or anxious appearing.  Calm demeanor.    Assessment and Plan: cough  Carmelina Dane, MD  Results for orders placed in visit on 02/04/12  POCT INFLUENZA A/B      Component Value Range   Influenza A, POC Positive     Influenza B, POC Negative

## 2012-02-08 ENCOUNTER — Telehealth: Payer: Self-pay

## 2012-02-08 NOTE — Telephone Encounter (Signed)
Left message for Allison Davies to call me back., on home number, since # given indicated this is not a private voice mail and may be checked by other staff members.

## 2012-02-08 NOTE — Telephone Encounter (Signed)
Please call Allison Davies. Based on the OV with Dr. Dareen Piano, her symptoms began 02/02/2012. We'd expect her to be improving a bit at this point.  She's coughing up yellow sputum now, but does she FEEL better or worse? When does she leave for the cruise?

## 2012-02-08 NOTE — Telephone Encounter (Signed)
Pt states that she was diagnosed with the flu and is complaining of metalic taste in her mouth and coughing up yellow mucus, pt states that she will be going on a cruise soon and would like to have a z-pac called in to clear up any infection that she may have. Best# [phone] Pharmacy: [facility]

## 2012-02-09 ENCOUNTER — Ambulatory Visit: Payer: BC Managed Care – PPO

## 2012-02-09 ENCOUNTER — Ambulatory Visit (INDEPENDENT_AMBULATORY_CARE_PROVIDER_SITE_OTHER): Payer: BC Managed Care – PPO | Admitting: Family Medicine

## 2012-02-09 VITALS — BP 118/70 | HR 76 | Temp 98.5°F | Resp 20 | Ht 66.0 in | Wt 177.0 lb

## 2012-02-09 DIAGNOSIS — J111 Influenza due to unidentified influenza virus with other respiratory manifestations: Secondary | ICD-10-CM

## 2012-02-09 DIAGNOSIS — H6690 Otitis media, unspecified, unspecified ear: Secondary | ICD-10-CM

## 2012-02-09 DIAGNOSIS — J40 Bronchitis, not specified as acute or chronic: Secondary | ICD-10-CM

## 2012-02-09 LAB — POCT CBC
MCH, POC: 25.4 pg — AB (ref 27–31.2)
MCV: 82.8 fL (ref 80–97)
MID (cbc): 0.5 (ref 0–0.9)
POC LYMPH PERCENT: 21.7 %L (ref 10–50)
Platelet Count, POC: 356 10*3/uL (ref 142–424)
RBC: 4.41 M/uL (ref 4.04–5.48)
WBC: 8.9 10*3/uL (ref 4.6–10.2)

## 2012-02-09 MED ORDER — BENZONATATE 200 MG PO CAPS: 200.0000 mg | ORAL_CAPSULE | Freq: Three times a day (TID) | ORAL | Status: DC | PRN

## 2012-02-09 MED ORDER — AZITHROMYCIN 250 MG PO TABS: ORAL_TABLET | ORAL | Status: DC

## 2012-02-09 NOTE — Telephone Encounter (Signed)
Pt is here for office visit.

## 2012-02-09 NOTE — Progress Notes (Signed)
Subjective:    Patient ID: Allison Davies, female    DOB: Mar 13, 1970, 41 y.o.   MRN: [id number] Chief Complaint  Patient presents with  . Influenza    now with cough has trip planned /wheezing    HPI  No further f/c, still fatigued and with cough with dark purulent sputum and DOE but not SHoB at rest.  Is sleeping a lot, cough wakes her up sometimes.  No other sick contacts.  Left ear was clogged but resolving, sore throat resolving.  Using Delsym but not working well.  She is leaving tomorrow to go drive [facility]  Tol po, pushing fluids, no n/v, no c/d. Has myalgias nmlly from lupus.  Past Medical History  Diagnosis Date  . GERD (gastroesophageal reflux disease)   . Lupus   . Morphea   . Joint pain   . Connective tissue disease   . Asthma   . Fatty liver   . Blood glucose abnormal   . Muscle spasm of back   . Headache   . Night terrors, adult   . Depression   . Hiatal hernia    Past Surgical History  Procedure Date  . Appendectomy   . Salpingectomy     bi-lateral   Current Outpatient Prescriptions on File Prior to Visit  Medication Sig Dispense Refill  . acetaminophen (TYLENOL) 500 MG tablet Take 1,000 mg by mouth every 6 (six) hours as needed.      Marland Kitchen albuterol (PROVENTIL HFA;VENTOLIN HFA) 108 (90 BASE) MCG/ACT inhaler Inhale 2 puffs into the lungs every 6 (six) hours as needed.      . calcium carbonate (TUMS EX) 750 MG chewable tablet Chew 1 tablet by mouth 2 (two) times daily.      . fluticasone (FLONASE) 50 MCG/ACT nasal spray Place 2 sprays into the nose as needed.      . Loratadine 10 MG CAPS Take by mouth as needed.      . Meclizine HCl (BONINE PO) Take by mouth as needed. Dizzy spells      . Menthol, Topical Analgesic, (ICY HOT MEDICATED SPRAY) 16 % LIQD Apply topically as needed.      Marland Kitchen OVER THE COUNTER MEDICATION Two old Goats, pt mail orders, uses lotion as needed for joint pain      . OVER THE COUNTER MEDICATION Neutrogena  Ultra Sheer sunblock , uses  everyday due to her Lupus      . pantoprazole (PROTONIX) 40 MG tablet Take 1 tablet (40 mg total) by mouth 2 (two) times daily.  90 tablet  6  . sucralfate (CARAFATE) 1 GM/10ML suspension Take 10 mLs (1 g total) by mouth 4 (four) times daily.  420 mL  1  . oseltamivir (TAMIFLU) 75 MG capsule Take 1 capsule (75 mg total) by mouth daily.  10 capsule  0   Allergies  Allergen Reactions  . Lemon Extract (Flavoring Agent) Anaphylaxis  . Prochlorperazine Edisylate Anaphylaxis  . Codeine Nausea And Vomiting    severe  . Promethazine Swelling  . Ativan (Lorazepam) Palpitations  . Hydrocodone Palpitations    Patient states that she can not take narcotics. Tylenol is the only pain medication she can take.  . Isometheptene-Apap-Dichloral Palpitations  . Klonopin (Clonazepam) Palpitations  . Moxifloxacin Rash  . Penicillins Hives and Rash  . Plaquenil (Hydroxychloroquine) Palpitations  . Xanax (Alprazolam) Palpitations      Review of Systems  Constitutional: Positive for fatigue. Negative for fever, chills, diaphoresis, activity change and  appetite change.  HENT: Positive for ear pain, congestion, sore throat, rhinorrhea, sneezing and sinus pressure. Negative for nosebleeds, trouble swallowing, neck pain, neck stiffness, voice change, postnasal drip and ear discharge.   Eyes: Negative for discharge and itching.  Respiratory: Positive for cough and shortness of breath.   Cardiovascular: Negative for chest pain.  Gastrointestinal: Negative for nausea, vomiting and abdominal pain.  Skin: Negative for rash.  Neurological: Positive for headaches. Negative for dizziness and syncope.  Hematological: Positive for adenopathy.  Psychiatric/Behavioral: Positive for sleep disturbance.        BP 118/70  Pulse 76  Temp 98.5 F (36.9 C)  Resp 20  Ht 5\' 6"  (1.676 m)  Wt 177 lb (80.287 kg)  BMI 28.57 kg/m2  SpO2 98%  LMP 12/28/2011 Objective:   Physical Exam  Constitutional: She is oriented to  person, place, and time. She appears well-developed and well-nourished. She appears lethargic. She appears ill. No distress.  HENT:  Head: Normocephalic and atraumatic.  Right Ear: External ear and ear canal normal. Tympanic membrane is bulging. A middle ear effusion is present.  Left Ear: External ear and ear canal normal. Tympanic membrane is injected, erythematous and bulging. A middle ear effusion is present.  Nose: Mucosal edema and rhinorrhea present. Right sinus exhibits maxillary sinus tenderness. Left sinus exhibits maxillary sinus tenderness.  Mouth/Throat: Uvula is midline and mucous membranes are normal. Posterior oropharyngeal erythema present. No oropharyngeal exudate, posterior oropharyngeal edema or tonsillar abscesses.  Eyes: Conjunctivae normal are normal. Right eye exhibits no discharge. Left eye exhibits no discharge. No scleral icterus.  Neck: Normal range of motion. Neck supple.  Cardiovascular: Normal rate, regular rhythm, normal heart sounds and intact distal pulses.   Pulmonary/Chest: Effort normal. She has wheezes in the left lower field. She has rhonchi in the right upper field.  Lymphadenopathy:       Head (right side): Submandibular adenopathy present. No preauricular and no posterior auricular adenopathy present.       Head (left side): Submandibular adenopathy present. No preauricular and no posterior auricular adenopathy present.    She has no cervical adenopathy.       Right: No supraclavicular adenopathy present.       Left: No supraclavicular adenopathy present.  Neurological: She is oriented to person, place, and time. She appears lethargic.  Skin: Skin is warm and dry. She is not diaphoretic. No erythema.  Psychiatric: She has a normal mood and affect. Her behavior is normal.     [facility] reading (PRIMARY) by  Dr. Clelia Croft. CXR:  Mildly increased central adenopathy, no ilfiltrative airspace disease  Results for orders placed in visit on 02/09/12  POCT CBC       Component Value Range   WBC 8.9  4.6 - 10.2 K/uL   Lymph, poc 1.9  0.6 - 3.4   POC LYMPH PERCENT 21.7  10 - 50 %L   MID (cbc) 0.5  0 - 0.9   POC MID % 5.1  0 - 12 %M   POC Granulocyte 6.5  2 - 6.9   Granulocyte percent 73.2  37 - 80 %G   RBC 4.41  4.04 - 5.48 M/uL   Hemoglobin 11.2 (*) 12.2 - 16.2 g/dL   HCT, POC 16.1 (*) 09.6 - 47.9 %   MCV 82.8  80 - 97 fL   MCH, POC 25.4 (*) 27 - 31.2 pg   MCHC 30.7 (*) 31.8 - 35.4 g/dL   RDW, POC 04.5  Platelet Count, POC 356  142 - 424 K/uL   MPV 7.8  0 - 99.8 fL    Assessment & Plan:   1. Flu  POCT CBC, DG Chest 2 View  2. Bronchitis    Rec netti pot or sinus rinse, zpack w/ prn tessalon. Meds ordered this encounter  Medications  . azithromycin (ZITHROMAX) 250 MG tablet    Sig: Take 2 tabs PO x 1 dose, then 1 tab PO QD x 4 days    Dispense:  6 tablet    Refill:  0  . benzonatate (TESSALON) 200 MG capsule    Sig: Take 1 capsule (200 mg total) by mouth 3 (three) times daily as needed for cough.    Dispense:  30 capsule    Refill:  0

## 2012-02-13 HISTORY — PX: ABDOMINAL HYSTERECTOMY: SHX81

## 2012-03-03 ENCOUNTER — Encounter (HOSPITAL_COMMUNITY): Admission: RE | Payer: Self-pay | Source: Ambulatory Visit

## 2012-03-03 ENCOUNTER — Ambulatory Visit (HOSPITAL_COMMUNITY)
Admission: RE | Admit: 2012-03-03 | Payer: BC Managed Care – PPO | Source: Ambulatory Visit | Admitting: Gastroenterology

## 2012-03-03 SURGERY — MANOMETRY, ESOPHAGUS

## 2012-03-07 ENCOUNTER — Other Ambulatory Visit: Payer: Self-pay | Admitting: *Deleted

## 2012-04-05 ENCOUNTER — Encounter: Payer: Self-pay | Admitting: Neurology

## 2012-04-05 DIAGNOSIS — R209 Unspecified disturbances of skin sensation: Secondary | ICD-10-CM

## 2012-04-05 DIAGNOSIS — R42 Dizziness and giddiness: Secondary | ICD-10-CM

## 2012-05-06 ENCOUNTER — Ambulatory Visit: Payer: BC Managed Care – PPO | Admitting: Neurology

## 2012-05-16 ENCOUNTER — Ambulatory Visit (INDEPENDENT_AMBULATORY_CARE_PROVIDER_SITE_OTHER): Payer: BC Managed Care – PPO | Admitting: Family Medicine

## 2012-05-16 VITALS — BP 116/78 | HR 69 | Temp 98.0°F | Resp 16 | Ht 66.0 in | Wt 182.0 lb

## 2012-05-16 DIAGNOSIS — J019 Acute sinusitis, unspecified: Secondary | ICD-10-CM

## 2012-05-16 MED ORDER — AZITHROMYCIN 250 MG PO TABS: ORAL_TABLET | ORAL | Status: DC

## 2012-05-16 NOTE — Patient Instructions (Addendum)
Use afrin for 2 days to get the pressure off the ear drums    Sinusitis Sinusitis is redness, soreness, and swelling (inflammation) of the paranasal sinuses. Paranasal sinuses are air pockets within the bones of your face (beneath the eyes, the middle of the forehead, or above the eyes). In healthy paranasal sinuses, mucus is able to drain out, and air is able to circulate through them by way of your nose. However, when your paranasal sinuses are inflamed, mucus and air can become trapped. This can allow bacteria and other germs to grow and cause infection. Sinusitis can develop quickly and last only a short time (acute) or continue over a long period (chronic). Sinusitis that lasts for more than 12 weeks is considered chronic.  CAUSES  Causes of sinusitis include:  Allergies.  Structural abnormalities, such as displacement of the cartilage that separates your nostrils (deviated septum), which can decrease the air flow through your nose and sinuses and affect sinus drainage.  Functional abnormalities, such as when the small hairs (cilia) that line your sinuses and help remove mucus do not work properly or are not present. SYMPTOMS  Symptoms of acute and chronic sinusitis are the same. The primary symptoms are pain and pressure around the affected sinuses. Other symptoms include:  Upper toothache.  Earache.  Headache.  Bad breath.  Decreased sense of smell and taste.  A cough, which worsens when you are lying flat.  Fatigue.  Fever.  Thick drainage from your nose, which often is green and may contain pus (purulent).  Swelling and warmth over the affected sinuses. DIAGNOSIS  Your caregiver will perform a physical exam. During the exam, your caregiver may:  Look in your nose for signs of abnormal growths in your nostrils (nasal polyps).  Tap over the affected sinus to check for signs of infection.  View the inside of your sinuses (endoscopy) with a special imaging device with  a light attached (endoscope), which is inserted into your sinuses. If your caregiver suspects that you have chronic sinusitis, one or more of the following tests may be recommended:  Allergy tests.  Nasal culture A sample of mucus is taken from your nose and sent to a lab and screened for bacteria.  Nasal cytology A sample of mucus is taken from your nose and examined by your caregiver to determine if your sinusitis is related to an allergy. TREATMENT  Most cases of acute sinusitis are related to a viral infection and will resolve on their own within 10 days. Sometimes medicines are prescribed to help relieve symptoms (pain medicine, decongestants, nasal steroid sprays, or saline sprays).  However, for sinusitis related to a bacterial infection, your caregiver will prescribe antibiotic medicines. These are medicines that will help kill the bacteria causing the infection.  Rarely, sinusitis is caused by a fungal infection. In theses cases, your caregiver will prescribe antifungal medicine. For some cases of chronic sinusitis, surgery is needed. Generally, these are cases in which sinusitis recurs more than 3 times per year, despite other treatments. HOME CARE INSTRUCTIONS   Drink plenty of water. Water helps thin the mucus so your sinuses can drain more easily.  Use a humidifier.  Inhale steam 3 to 4 times a day (for example, sit in the bathroom with the shower running).  Apply a warm, moist washcloth to your face 3 to 4 times a day, or as directed by your caregiver.  Use saline nasal sprays to help moisten and clean your sinuses.  Take over-the-counter or  prescription medicines for pain, discomfort, or fever only as directed by your caregiver. SEEK IMMEDIATE MEDICAL CARE IF:  You have increasing pain or severe headaches.  You have nausea, vomiting, or drowsiness.  You have swelling around your face.  You have vision problems.  You have a stiff neck.  You have difficulty  breathing. MAKE SURE YOU:   Understand these instructions.  Will watch your condition.  Will get help right away if you are not doing well or get worse. Document Released: 01/29/2005 Document Revised: 04/23/2011 Document Reviewed: 02/13/2011 [facility] Patient Information 2013 [place], [place].

## 2012-05-16 NOTE — Progress Notes (Signed)
Patient ID: Allison Davies MRN: [id number], DOB: Oct 15, 1970, 42 y.o. Date of Encounter: 05/16/2012, 9:00 AM  Primary Physician: Darnelle Going, MD  Chief Complaint:  Chief Complaint  Patient presents with  . URI  . Otalgia    Right ear    HPI: 42 y.o. year old female presents with 7 day history of nasal congestion, post nasal drip, sore throat, sinus pressure, and cough. Afebrile. No chills. Nasal congestion thick and green/yellow. Sinus pressure is the worst symptom. Cough is productive secondary to post nasal drip and not associated with time of day. Ears feel full, leading to sensation of muffled hearing. Has tried OTC cold preps without success. No GI complaints.   No recent antibiotics, recent travels, or sick contacts   No leg trauma, sedentary periods, h/o cancer, or tobacco use.  Past Medical History  Diagnosis Date  . GERD (gastroesophageal reflux disease)   . Lupus   . Morphea   . Joint pain   . Connective tissue disease   . Asthma   . Fatty liver   . Blood glucose abnormal   . Muscle spasm of back   . Headache   . Night terrors, adult   . Depression   . Hiatal hernia      Home Meds: Prior to Admission medications   Medication Sig Start Date End Date Taking? Authorizing Provider  acetaminophen (TYLENOL) 500 MG tablet Take 1,000 mg by mouth every 6 (six) hours as needed.   Yes Historical Provider, MD  albuterol (PROVENTIL HFA;VENTOLIN HFA) 108 (90 BASE) MCG/ACT inhaler Inhale 2 puffs into the lungs every 6 (six) hours as needed.   Yes Historical Provider, MD  calcium carbonate (TUMS EX) 750 MG chewable tablet Chew 1 tablet by mouth 2 (two) times daily.   Yes Historical Provider, MD  fluticasone (FLONASE) 50 MCG/ACT nasal spray Place 2 sprays into the nose as needed.   Yes Historical Provider, MD  Meclizine HCl (BONINE PO) Take by mouth as needed. Dizzy spells   Yes Historical Provider, MD  Menthol, Topical Analgesic, (ICY HOT MEDICATED SPRAY) 16 % LIQD Apply  topically as needed.   Yes Historical Provider, MD  OVER THE COUNTER MEDICATION Two old Goats, pt mail orders, uses lotion as needed for joint pain   Yes Historical Provider, MD  OVER THE COUNTER MEDICATION Neutrogena  Ultra Sheer sunblock , uses everyday due to her Lupus   Yes Historical Provider, MD  pantoprazole (PROTONIX) 40 MG tablet Take 1 tablet (40 mg total) by mouth 2 (two) times daily. 12/24/11  Yes Mardella Layman, MD  sucralfate (CARAFATE) 1 GM/10ML suspension Take 10 mLs (1 g total) by mouth 4 (four) times daily. 01/15/12  Yes Mardella Layman, MD  azithromycin (ZITHROMAX Z-PAK) 250 MG tablet Take as directed on pack 05/16/12   Elvina Sidle, MD  benzonatate (TESSALON) 200 MG capsule Take 1 capsule (200 mg total) by mouth 3 (three) times daily as needed for cough. 02/09/12   Sherren Mocha, MD  Loratadine 10 MG CAPS Take by mouth as needed.    Historical Provider, MD  oseltamivir (TAMIFLU) 75 MG capsule Take 1 capsule (75 mg total) by mouth daily. 02/04/12   Phillips Odor, MD    Allergies:  Allergies  Allergen Reactions  . Lemon Extract (Flavoring Agent) Anaphylaxis  . Prochlorperazine Edisylate Anaphylaxis  . Codeine Nausea And Vomiting    severe  . Promethazine Swelling  . Ativan (Lorazepam) Palpitations  . Hydrocodone Palpitations  Patient states that she can not take narcotics. Tylenol is the only pain medication she can take.  . Isometheptene-Apap-Dichloral Palpitations  . Klonopin (Clonazepam) Palpitations  . Moxifloxacin Rash  . Penicillins Hives and Rash  . Plaquenil (Hydroxychloroquine) Palpitations  . Xanax (Alprazolam) Palpitations    History   Social History  . Marital Status: Married    Spouse Name: N/A    Number of Children: 3  . Years of Education: N/A   Occupational History  . CMA [employer]   Social History Main Topics  . Smoking status: Former Smoker    Quit date: 02/29/2004  . Smokeless tobacco: Never Used  . Alcohol  Use: Yes     Comment: only on vacation x 1 drink  . Drug Use: No  . Sexually Active: Yes   Other Topics Concern  . Not on file   Social History Narrative  . No narrative on file     Review of Systems: Constitutional: negative for chills, fever, night sweats or weight changes Cardiovascular: negative for chest pain or palpitations Respiratory: negative for hemoptysis, wheezing, or shortness of breath Abdominal: negative for abdominal pain, nausea, vomiting or diarrhea Dermatological: negative for rash Neurologic: negative for headache   Physical Exam: Blood pressure 116/78, pulse 69, temperature 98 F (36.7 C), temperature source Oral, resp. rate 16, height 5\' 6"  (1.676 m), weight 182 lb (82.555 kg), SpO2 99.00%., Body mass index is 29.39 kg/(m^2). General: Well developed, well nourished, in no acute distress. Head: Normocephalic, atraumatic, eyes without discharge, sclera non-icteric, nares are congested. Bilateral auditory canals clear, TM's are without perforation, pearly grey with reflective cone of light bilaterally. Serous effusion bilaterally behind TM's. Maxillary sinus TTP. Oral cavity moist, dentition normal. Posterior pharynx with post nasal drip and mild erythema. No peritonsillar abscess or tonsillar exudate. Neck: Supple. No thyromegaly. Full ROM. No lymphadenopathy. Lungs: Clear bilaterally to auscultation without wheezes, rales, or rhonchi. Breathing is unlabored.  Heart: RRR with S1 S2. No murmurs, rubs, or gallops appreciated. Msk:  Strength and tone normal for age. Extremities: No clubbing or cyanosis. No edema. Neuro: Alert and oriented X 3. Moves all extremities spontaneously. CNII-XII grossly in tact. Psych:  Responds to questions appropriately with a normal affect.   Labs:   ASSESSMENT AND PLAN:  42 y.o. year old female with sinusitis -Acute sinusitis - Plan: azithromycin (ZITHROMAX Z-PAK) 250 MG tablet continue flonase Use afrin for 2  days   -Tylenol/Motrin prn -Rest/fluids -RTC precautions -RTC 3-5 days if no improvement  Signed, Elvina Sidle, MD 05/16/2012 9:00 AM

## 2012-08-28 ENCOUNTER — Other Ambulatory Visit: Payer: Self-pay | Admitting: Gynecology

## 2012-10-07 ENCOUNTER — Encounter (HOSPITAL_COMMUNITY)
Admission: RE | Admit: 2012-10-07 | Discharge: 2012-10-07 | Disposition: A | Discharge status: Home / Self Care | Payer: BC Managed Care – PPO | Source: Ambulatory Visit | Attending: Obstetrics and Gynecology | Admitting: Obstetrics and Gynecology

## 2012-10-07 ENCOUNTER — Encounter (HOSPITAL_COMMUNITY): Payer: Self-pay

## 2012-10-07 DIAGNOSIS — Z01818 Encounter for other preprocedural examination: Secondary | ICD-10-CM | POA: Insufficient documentation

## 2012-10-07 DIAGNOSIS — Z01812 Encounter for preprocedural laboratory examination: Secondary | ICD-10-CM | POA: Insufficient documentation

## 2012-10-07 HISTORY — DX: Anxiety disorder, unspecified: F41.9

## 2012-10-07 LAB — CBC
HCT: 38.1 % (ref 36.0–46.0)
Hemoglobin: 13.1 g/dL (ref 12.0–15.0)
MCHC: 34.4 g/dL (ref 30.0–36.0)
RBC: 4.28 MIL/uL (ref 3.87–5.11)
WBC: 8.4 10*3/uL (ref 4.0–10.5)

## 2012-10-07 NOTE — Patient Instructions (Addendum)
   Your procedure is scheduled on: Thursday, 9/4  Enter through the Main Entrance of [facility] at: 1130 am Pick up the phone at the desk and dial [phone] and inform us of your arrival.  Please call this number if you have any problems the morning of surgery: [phone]  Remember: Do not eat food after midnight: Wednesday Do not drink clear liquids after: 9 am Thursday - day of surgery Take these medicines the morning of surgery with a SIP OF WATER:  protonix  Do not wear jewelry, make-up, or FINGER nail polish No metal in your hair or on your body. Do not wear lotions, powders, perfumes. You may wear deodorant.  Please use your CHG wash as directed prior to surgery.  Do not shave anywhere for at least 12 hours prior to first CHG shower.  Do not bring valuables to the hospital. Contacts, dentures or bridgework may not be worn into surgery.  Leave suitcase in the car. After Surgery it may be brought to your room. For patients being admitted to the hospital, checkout time is 11:00am the day of discharge.  Patients discharged on the day of surgery will not be allowed to drive home.  Home with husband Rob.

## 2012-10-15 NOTE — H&P (Addendum)
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

## 2012-10-16 ENCOUNTER — Ambulatory Visit (HOSPITAL_COMMUNITY)
Admission: RE | Admit: 2012-10-16 | Discharge: 2012-10-16 | Disposition: A | Discharge status: Home / Self Care | Payer: BC Managed Care – PPO | Source: Ambulatory Visit | Attending: Obstetrics and Gynecology | Admitting: Obstetrics and Gynecology

## 2012-10-16 ENCOUNTER — Ambulatory Visit (HOSPITAL_COMMUNITY): Payer: BC Managed Care – PPO | Admitting: Anesthesiology

## 2012-10-16 ENCOUNTER — Encounter (HOSPITAL_COMMUNITY)
Admission: RE | Disposition: A | Discharge status: Home / Self Care | Payer: Self-pay | Source: Ambulatory Visit | Attending: Obstetrics and Gynecology

## 2012-10-16 ENCOUNTER — Encounter (HOSPITAL_COMMUNITY): Payer: Self-pay | Admitting: Anesthesiology

## 2012-10-16 DIAGNOSIS — N8501 Benign endometrial hyperplasia: Secondary | ICD-10-CM | POA: Insufficient documentation

## 2012-10-16 DIAGNOSIS — N840 Polyp of corpus uteri: Secondary | ICD-10-CM | POA: Insufficient documentation

## 2012-10-16 HISTORY — PX: HYSTEROSCOPY W/D&C: SHX1775

## 2012-10-16 SURGERY — DILATATION AND CURETTAGE /HYSTEROSCOPY
Anesthesia: General | Site: Vagina | Wound class: Clean Contaminated

## 2012-10-16 MED ORDER — LIDOCAINE HCL (CARDIAC) 20 MG/ML IV SOLN
INTRAVENOUS | Status: AC
  Filled 2012-10-16: qty 5

## 2012-10-16 MED ORDER — PROPOFOL 10 MG/ML IV BOLUS
INTRAVENOUS | Status: DC | PRN
  Administered 2012-10-16: 160 mg via INTRAVENOUS

## 2012-10-16 MED ORDER — LACTATED RINGERS IV SOLN
INTRAVENOUS | Status: DC
  Administered 2012-10-16: 12:00:00 via INTRAVENOUS

## 2012-10-16 MED ORDER — KETOROLAC TROMETHAMINE 30 MG/ML IJ SOLN
INTRAMUSCULAR | Status: DC | PRN
  Administered 2012-10-16: 30 mg via INTRAVENOUS

## 2012-10-16 MED ORDER — ONDANSETRON HCL 4 MG/2ML IJ SOLN
INTRAMUSCULAR | Status: AC
  Filled 2012-10-16: qty 2

## 2012-10-16 MED ORDER — LIDOCAINE HCL 0.5 % IJ SOLN
INTRAMUSCULAR | Status: AC
  Filled 2012-10-16: qty 1

## 2012-10-16 MED ORDER — LIDOCAINE HCL (CARDIAC) 20 MG/ML IV SOLN
INTRAVENOUS | Status: DC | PRN
  Administered 2012-10-16: 70 mg via INTRAVENOUS
  Administered 2012-10-16: 30 mg via INTRAVENOUS

## 2012-10-16 MED ORDER — ONDANSETRON HCL 4 MG/2ML IJ SOLN
INTRAMUSCULAR | Status: DC | PRN
  Administered 2012-10-16: 4 mg via INTRAVENOUS

## 2012-10-16 MED ORDER — FENTANYL CITRATE 0.05 MG/ML IJ SOLN: 25.0000 ug | INTRAMUSCULAR | Status: DC | PRN

## 2012-10-16 MED ORDER — MIDAZOLAM HCL 2 MG/2ML IJ SOLN
INTRAMUSCULAR | Status: AC
  Filled 2012-10-16: qty 2

## 2012-10-16 MED ORDER — FENTANYL CITRATE 0.05 MG/ML IJ SOLN
INTRAMUSCULAR | Status: DC | PRN
  Administered 2012-10-16 (×2): 50 ug via INTRAVENOUS

## 2012-10-16 MED ORDER — GLYCINE 1.5 % IR SOLN
Status: DC | PRN
  Administered 2012-10-16: 3000 mL

## 2012-10-16 MED ORDER — FENTANYL CITRATE 0.05 MG/ML IJ SOLN
INTRAMUSCULAR | Status: AC
  Filled 2012-10-16: qty 2

## 2012-10-16 MED ORDER — PROPOFOL 10 MG/ML IV EMUL
INTRAVENOUS | Status: AC
  Filled 2012-10-16: qty 20

## 2012-10-16 MED ORDER — LIDOCAINE HCL 1 % IJ SOLN
INTRAMUSCULAR | Status: DC | PRN
  Administered 2012-10-16: 10 mL

## 2012-10-16 MED ORDER — MIDAZOLAM HCL 2 MG/2ML IJ SOLN
INTRAMUSCULAR | Status: DC | PRN
  Administered 2012-10-16: 1 mg via INTRAVENOUS

## 2012-10-16 MED ORDER — KETOROLAC TROMETHAMINE 30 MG/ML IJ SOLN
INTRAMUSCULAR | Status: AC
  Filled 2012-10-16: qty 1

## 2012-10-16 SURGICAL SUPPLY — 18 items
ABLATOR ENDOMETRIAL BIPOLAR (ABLATOR) IMPLANT
CANISTER SUCTION 2500CC (MISCELLANEOUS) ×2 IMPLANT
CATH ROBINSON RED A/P 16FR (CATHETERS) ×2 IMPLANT
CATH THERMACHOICE III (CATHETERS) IMPLANT
CLOTH BEACON ORANGE TIMEOUT ST (SAFETY) ×2 IMPLANT
CONTAINER PREFILL 10% NBF 60ML (FORM) ×4 IMPLANT
DRESSING TELFA 8X3 (GAUZE/BANDAGES/DRESSINGS) ×2 IMPLANT
ELECT REM PT RETURN 9FT ADLT (ELECTROSURGICAL) ×2
ELECTRODE REM PT RTRN 9FT ADLT (ELECTROSURGICAL) ×1 IMPLANT
GLOVE BIO SURGEON STRL SZ 6.5 (GLOVE) ×2 IMPLANT
GLOVE BIOGEL PI IND STRL 7.0 (GLOVE) ×1 IMPLANT
GLOVE BIOGEL PI INDICATOR 7.0 (GLOVE) ×1
GOWN STRL REIN XL XLG (GOWN DISPOSABLE) ×4 IMPLANT
LOOP ANGLED CUTTING 22FR (CUTTING LOOP) IMPLANT
PACK HYSTEROSCOPY LF (CUSTOM PROCEDURE TRAY) ×2 IMPLANT
PAD OB MATERNITY 4.3X12.25 (PERSONAL CARE ITEMS) ×2 IMPLANT
TOWEL OR 17X24 6PK STRL BLUE (TOWEL DISPOSABLE) ×4 IMPLANT
WATER STERILE IRR 1000ML POUR (IV SOLUTION) ×2 IMPLANT

## 2012-10-16 NOTE — Anesthesia Preprocedure Evaluation (Signed)
Anesthesia Evaluation  Patient identified by MRN, date of birth, ID band Patient awake    Reviewed: Allergy & Precautions, H&P , Patient's Chart, lab work & pertinent test results, reviewed documented beta blocker date and time   Airway Mallampati: II TM Distance: >3 FB Neck ROM: full    Dental no notable dental hx.    Pulmonary  breath sounds clear to auscultation  Pulmonary exam normal       Cardiovascular Rhythm:regular Rate:Normal     Neuro/Psych    GI/Hepatic GERD-  Medicated,  Endo/Other    Renal/GU      Musculoskeletal   Abdominal   Peds  Hematology   Anesthesia Other Findings Lupus back pain only. No other sx  Reproductive/Obstetrics                           Anesthesia Physical Anesthesia Plan  ASA: II  Anesthesia Plan:    Post-op Pain Management:    Induction: Intravenous  Airway Management Planned: LMA  Additional Equipment:   Intra-op Plan:   Post-operative Plan:   Informed Consent: I have reviewed the patients History and Physical, chart, labs and discussed the procedure including the risks, benefits and alternatives for the proposed anesthesia with the patient or authorized representative who has indicated his/her understanding and acceptance.   Dental Advisory Given and Dental advisory given  Plan Discussed with: CRNA and Surgeon  Anesthesia Plan Comments:         Anesthesia Quick Evaluation

## 2012-10-16 NOTE — Transfer of Care (Signed)
Immediate Anesthesia Transfer of Care Note  Patient: Allison Davies  Procedure(s) Performed: Procedure(s): DILATATION AND CURETTAGE /HYSTEROSCOPY (N/A)  Patient Location: PACU  Anesthesia Type:General  Level of Consciousness: awake, sedated and patient cooperative  Airway & Oxygen Therapy: Patient Spontanous Breathing and Patient connected to nasal cannula oxygen  Post-op Assessment: Report given to PACU RN  Post vital signs: Reviewed and stable  Complications: No apparent anesthesia complications

## 2012-10-16 NOTE — Anesthesia Postprocedure Evaluation (Signed)
  Anesthesia Post-op Note  Patient: Allison Davies  Procedure(s) Performed: Procedure(s): DILATATION AND CURETTAGE /HYSTEROSCOPY (N/A) Patient is awake and responsive. Pain and nausea are reasonably well controlled. Vital signs are stable and clinically acceptable. Oxygen saturation is clinically acceptable. There are no apparent anesthetic complications at this time. Patient is ready for discharge.

## 2012-10-17 ENCOUNTER — Encounter (HOSPITAL_COMMUNITY): Payer: Self-pay | Admitting: Obstetrics and Gynecology

## 2012-10-21 NOTE — Op Note (Signed)
NAMEKMYA, PLACIDE NO.:  [id number]  MEDICAL RECORD NO.:  [id number]  LOCATION:  [facility]                          FACILITY:  [facility]  PHYSICIAN:  Zelphia Cairo, MD    DATE OF BIRTH:  [birth year]  DATE OF PROCEDURE: DATE OF DISCHARGE:  10/16/2012                              OPERATIVE REPORT   PREOPERATIVE DIAGNOSIS:  Complex endometrial hyperplasia.  POSTOPERATIVE DIAGNOSIS:  Complex endometrial hyperplasia, path pending.  PROCEDURE: 1. Cervical block. 2. Hysteroscopy. 3. Dilation and curettage.  ANESTHESIA:  General, local.  SURGEON:  Zelphia Cairo, MD  COMPLICATIONS:  None.  CONDITION:  Stable to recovery room.  PROCEDURE IN DETAIL:  The patient was taken to the operating room after informed consent was obtained.  She was given general anesthesia. Placed in the dorsal lithotomy position using Allen stirrups.  She was prepped and draped in the sterile fashion.  An in- and out catheter was used to drain her bladder.  Bivalve speculum was placed in the vagina and 1 mL of local anesthesia was placed at the 12 o'clock position of the cervix.  Single-tooth tenaculum was attached to the cervix, and the remaining 9 mL was used to perform a cervical block.  The cervix was easily dilated with [facility] dilators, and the diagnostic hysteroscope was inserted.  A survey was performed.  Hysteroscope was removed, and a gentle curetting was performed throughout the endometrial cavity. Hysteroscope was reinserted.  No other abnormalities, polyps or masses were noted throughout the cavity.  Hysteroscope was removed.  Specimen was placed on Telfa, passed off to be sent to pathology.  Tenaculum was removed.  The cervix was hemostatic.  Speculum was removed.  The patient was extubated and taken to the recovery room in stable condition. Sponge, lap, needle, and instrument counts were correct x2.     Zelphia Cairo, MD    GA/MEDQ  D:  10/21/2012  T:  10/21/2012  Job:   [id number]

## 2012-12-18 ENCOUNTER — Other Ambulatory Visit: Payer: Self-pay

## 2013-01-20 ENCOUNTER — Other Ambulatory Visit: Payer: Self-pay | Admitting: Obstetrics and Gynecology

## 2013-01-21 ENCOUNTER — Other Ambulatory Visit: Payer: Self-pay | Admitting: *Deleted

## 2013-01-21 MED ORDER — PANTOPRAZOLE SODIUM 40 MG PO TBEC: 40.0000 mg | DELAYED_RELEASE_TABLET | Freq: Two times a day (BID) | ORAL | Status: DC

## 2013-01-26 ENCOUNTER — Other Ambulatory Visit: Payer: Self-pay | Admitting: *Deleted

## 2013-01-26 MED ORDER — PANTOPRAZOLE SODIUM 40 MG PO TBEC: 40.0000 mg | DELAYED_RELEASE_TABLET | Freq: Two times a day (BID) | ORAL | Status: DC

## 2013-01-26 NOTE — Telephone Encounter (Signed)
WANTED RX SENT TO [facility] NOT [facility].

## 2013-03-31 IMAGING — CR DG CHEST 2V
2 series · 2 of 2 positions shown · non-contrast
Comparison: 01/10/2010

CLINICAL DATA: Cough.  Fatigue.  Lupus.

CHEST - 2 VIEW

[PA]
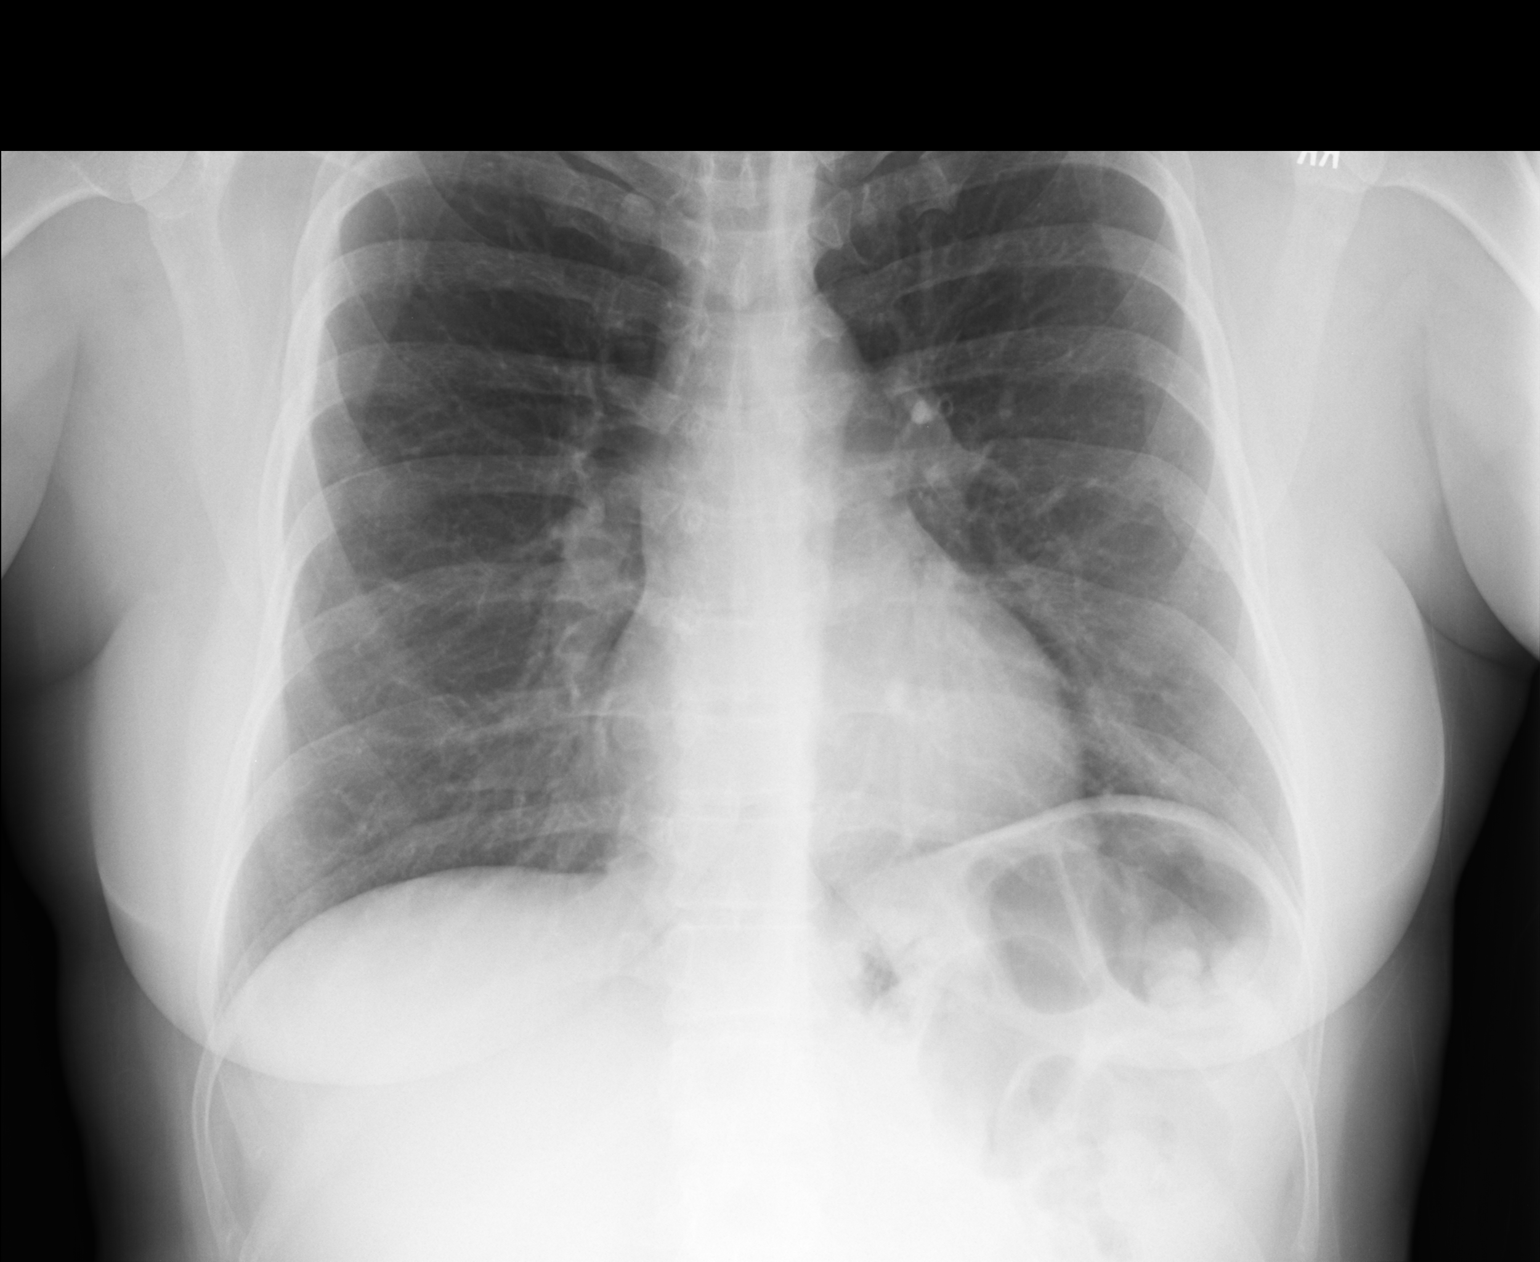

[lateral]
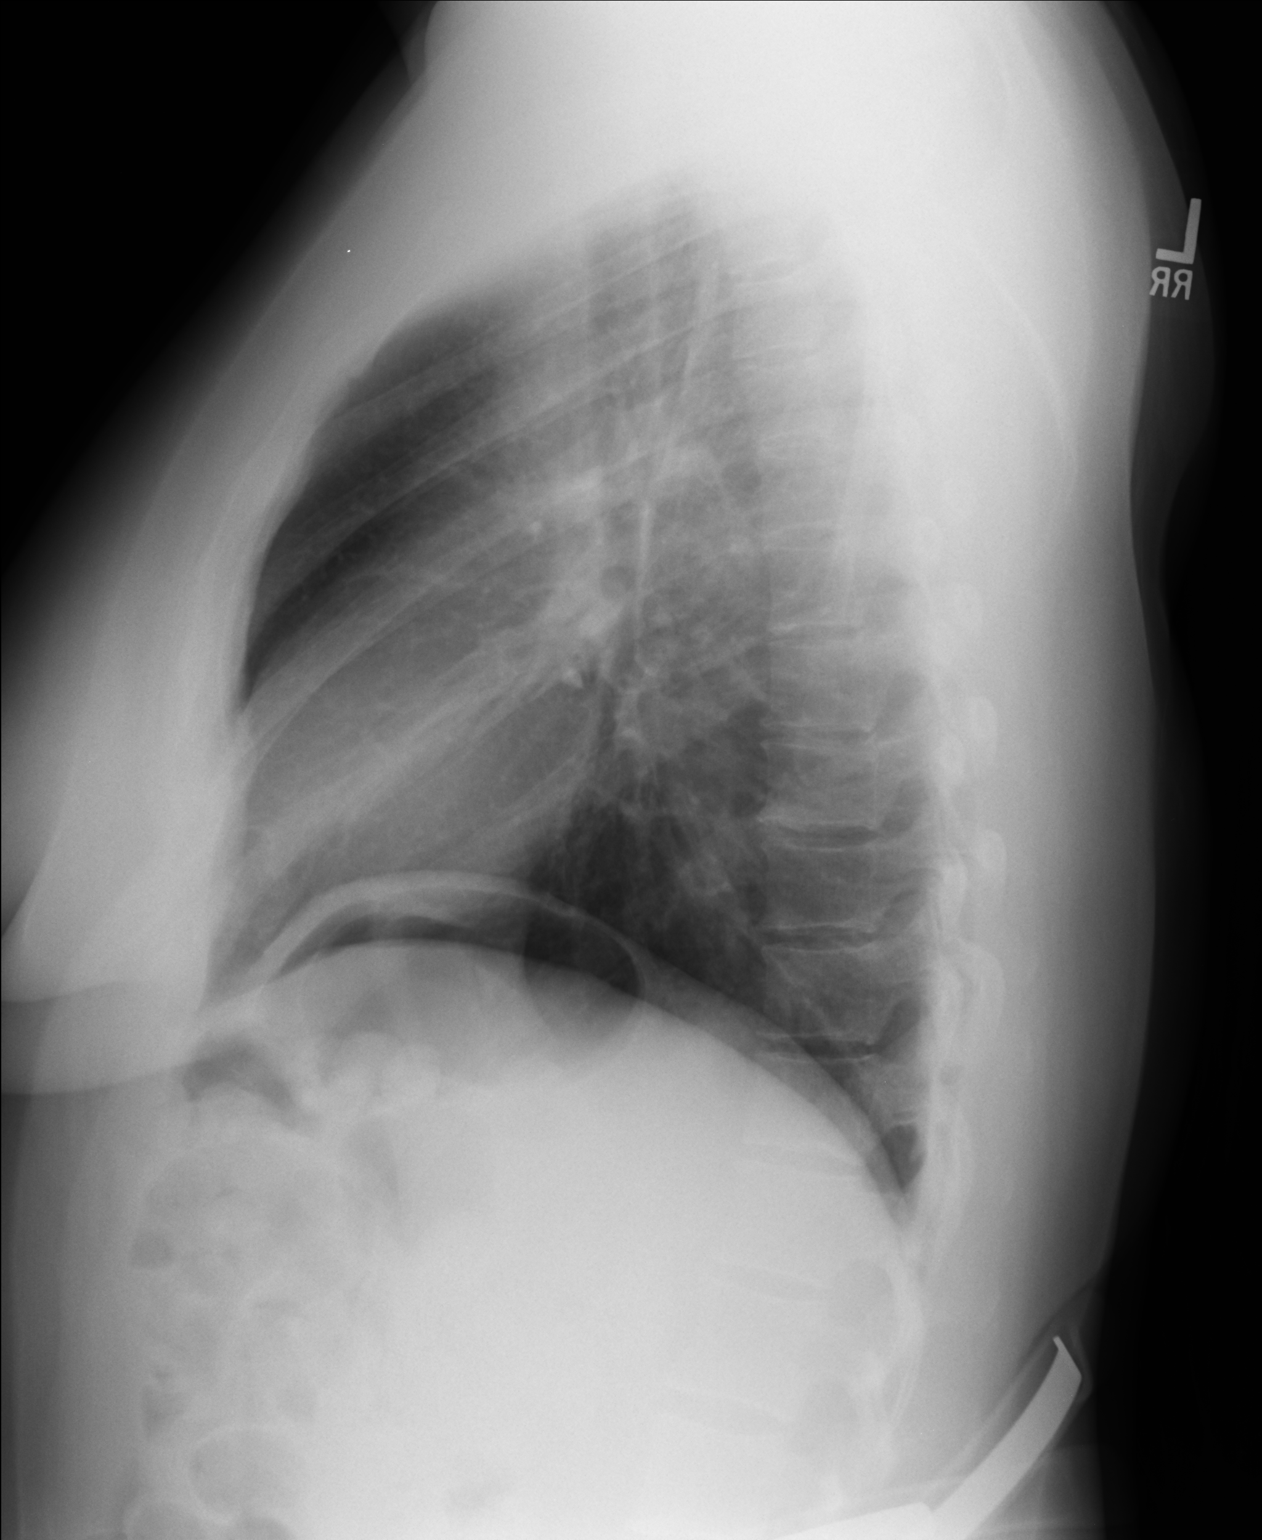

[2 of 2 positions shown; findings below may reference images not displayed]

FINDINGS: Improved aeration of both lungs is seen.  No evidence of
pulmonary filtrate or edema.  No evidence of pleural effusion.
Heart size is within normal limits.  No definite mass or
lymphadenopathy identified.
IMPRESSION: No active disease.

## 2013-04-22 ENCOUNTER — Encounter (HOSPITAL_COMMUNITY): Payer: Self-pay | Admitting: Pharmacy Technician

## 2013-04-23 NOTE — Patient Instructions (Signed)
Your procedure is scheduled on: Tuesday, April 28, 2013  Enter through the [employer] [facility] at:  8:30am  Pick up the phone at the desk and dial [phone].  Call this number if you have problems the morning of surgery: [phone].  Remember: Do NOT eat food: AFTER MIDNIGHT MONDAY Do NOT drink clear liquids after: AFTER MIDNIGHT MONDAY Take these medicines the morning of surgery with a SIP OF WATER: PROTONIX  Do NOT wear jewelry (body piercing), make-up, or nail polish. Do NOT wear lotions, powders, or perfumes.  You may wear deoderant. Do NOT shave for 48 hours prior to surgery. Do NOT bring valuables to the hospital. Contacts, dentures, or bridgework may not be worn into surgery. Leave suitcase in car.  After surgery it may be brought to your room.  For patients admitted to the hospital, checkout time is 11:00 AM the day of discharge.

## 2013-04-24 ENCOUNTER — Encounter (HOSPITAL_COMMUNITY): Payer: Self-pay

## 2013-04-24 ENCOUNTER — Encounter (HOSPITAL_COMMUNITY)
Admission: RE | Admit: 2013-04-24 | Discharge: 2013-04-24 | Disposition: A | Discharge status: Home / Self Care | Payer: BC Managed Care – PPO | Source: Ambulatory Visit | Attending: Obstetrics and Gynecology | Admitting: Obstetrics and Gynecology

## 2013-04-24 DIAGNOSIS — Z01812 Encounter for preprocedural laboratory examination: Secondary | ICD-10-CM | POA: Insufficient documentation

## 2013-04-24 LAB — SURGICAL PCR SCREEN
MRSA, PCR: NEGATIVE
Staphylococcus aureus: NEGATIVE

## 2013-04-24 LAB — TYPE AND SCREEN
ABO/RH(D): O NEG
Antibody Screen: NEGATIVE

## 2013-04-24 LAB — CBC
HCT: 39.5 % (ref 36.0–46.0)
Hemoglobin: 13.7 g/dL (ref 12.0–15.0)
MCH: 32.1 pg (ref 26.0–34.0)
MCHC: 34.7 g/dL (ref 30.0–36.0)
MCV: 92.5 fL (ref 78.0–100.0)
PLATELETS: 221 10*3/uL (ref 150–400)
RBC: 4.27 MIL/uL (ref 3.87–5.11)
RDW: 14.5 % (ref 11.5–15.5)
WBC: 8 10*3/uL (ref 4.0–10.5)

## 2013-04-24 LAB — ABO/RH: ABO/RH(D): O NEG

## 2013-04-27 MED ORDER — METRONIDAZOLE IN NACL 5-0.79 MG/ML-% IV SOLN
500.0000 mg | INTRAVENOUS | Status: AC
  Administered 2013-04-28: 500 mg via INTRAVENOUS
  Filled 2013-04-27: qty 100

## 2013-04-27 MED ORDER — GENTAMICIN SULFATE 40 MG/ML IJ SOLN
5.0000 mg/kg | INTRAMUSCULAR | Status: AC
  Administered 2013-04-28: 350 mg via INTRAVENOUS
  Filled 2013-04-27: qty 8.75

## 2013-04-28 ENCOUNTER — Encounter (HOSPITAL_COMMUNITY)
Admission: RE | Disposition: A | Discharge status: Home / Self Care | Payer: Self-pay | Source: Ambulatory Visit | Attending: Obstetrics and Gynecology

## 2013-04-28 ENCOUNTER — Ambulatory Visit (HOSPITAL_COMMUNITY): Payer: BC Managed Care – PPO | Admitting: Certified Registered Nurse Anesthetist

## 2013-04-28 ENCOUNTER — Ambulatory Visit (HOSPITAL_COMMUNITY)
Admission: RE | Admit: 2013-04-28 | Discharge: 2013-04-29 | Disposition: A | Discharge status: Home / Self Care | Payer: BC Managed Care – PPO | Source: Ambulatory Visit | Attending: Obstetrics and Gynecology | Admitting: Obstetrics and Gynecology

## 2013-04-28 ENCOUNTER — Encounter (HOSPITAL_COMMUNITY): Payer: Self-pay

## 2013-04-28 ENCOUNTER — Encounter (HOSPITAL_COMMUNITY): Payer: BC Managed Care – PPO | Admitting: Certified Registered Nurse Anesthetist

## 2013-04-28 DIAGNOSIS — J45909 Unspecified asthma, uncomplicated: Secondary | ICD-10-CM | POA: Insufficient documentation

## 2013-04-28 DIAGNOSIS — Z87891 Personal history of nicotine dependence: Secondary | ICD-10-CM | POA: Insufficient documentation

## 2013-04-28 DIAGNOSIS — M329 Systemic lupus erythematosus, unspecified: Secondary | ICD-10-CM | POA: Insufficient documentation

## 2013-04-28 DIAGNOSIS — L940 Localized scleroderma [morphea]: Secondary | ICD-10-CM | POA: Insufficient documentation

## 2013-04-28 DIAGNOSIS — G709 Myoneural disorder, unspecified: Secondary | ICD-10-CM | POA: Insufficient documentation

## 2013-04-28 DIAGNOSIS — K219 Gastro-esophageal reflux disease without esophagitis: Secondary | ICD-10-CM | POA: Insufficient documentation

## 2013-04-28 DIAGNOSIS — D252 Subserosal leiomyoma of uterus: Secondary | ICD-10-CM | POA: Insufficient documentation

## 2013-04-28 DIAGNOSIS — N8501 Benign endometrial hyperplasia: Secondary | ICD-10-CM | POA: Insufficient documentation

## 2013-04-28 DIAGNOSIS — N949 Unspecified condition associated with female genital organs and menstrual cycle: Secondary | ICD-10-CM | POA: Insufficient documentation

## 2013-04-28 DIAGNOSIS — K449 Diaphragmatic hernia without obstruction or gangrene: Secondary | ICD-10-CM | POA: Insufficient documentation

## 2013-04-28 DIAGNOSIS — Z90710 Acquired absence of both cervix and uterus: Secondary | ICD-10-CM | POA: Diagnosis present

## 2013-04-28 DIAGNOSIS — E119 Type 2 diabetes mellitus without complications: Secondary | ICD-10-CM | POA: Insufficient documentation

## 2013-04-28 DIAGNOSIS — F3289 Other specified depressive episodes: Secondary | ICD-10-CM | POA: Insufficient documentation

## 2013-04-28 DIAGNOSIS — F329 Major depressive disorder, single episode, unspecified: Secondary | ICD-10-CM | POA: Insufficient documentation

## 2013-04-28 DIAGNOSIS — N938 Other specified abnormal uterine and vaginal bleeding: Secondary | ICD-10-CM | POA: Insufficient documentation

## 2013-04-28 DIAGNOSIS — N9489 Other specified conditions associated with female genital organs and menstrual cycle: Secondary | ICD-10-CM | POA: Insufficient documentation

## 2013-04-28 HISTORY — PX: VULVA /PERINEUM BIOPSY: SHX319

## 2013-04-28 HISTORY — PX: LAPAROSCOPIC ASSISTED VAGINAL HYSTERECTOMY: SHX5398

## 2013-04-28 LAB — TYPE AND SCREEN
ABO/RH(D): O NEG
Antibody Screen: NEGATIVE

## 2013-04-28 LAB — PREGNANCY, URINE: Preg Test, Ur: NEGATIVE

## 2013-04-28 SURGERY — HYSTERECTOMY, VAGINAL, LAPAROSCOPY-ASSISTED
Anesthesia: General | Site: Perineum

## 2013-04-28 MED ORDER — KETOROLAC TROMETHAMINE 30 MG/ML IJ SOLN
30.0000 mg | Freq: Four times a day (QID) | INTRAMUSCULAR | Status: DC
  Administered 2013-04-28 – 2013-04-29 (×3): 30 mg via INTRAVENOUS
  Filled 2013-04-28 (×3): qty 1

## 2013-04-28 MED ORDER — ONDANSETRON HCL 4 MG/2ML IJ SOLN
INTRAMUSCULAR | Status: AC
  Filled 2013-04-28: qty 2

## 2013-04-28 MED ORDER — LACTATED RINGERS IR SOLN
Status: DC | PRN
  Administered 2013-04-28: 3000 mL

## 2013-04-28 MED ORDER — SIMETHICONE 80 MG PO CHEW
80.0000 mg | CHEWABLE_TABLET | Freq: Four times a day (QID) | ORAL | Status: DC | PRN
Start: 2013-04-28 — End: 2013-04-29

## 2013-04-28 MED ORDER — ONDANSETRON HCL 4 MG PO TABS: 4.0000 mg | ORAL_TABLET | Freq: Four times a day (QID) | ORAL | Status: DC | PRN

## 2013-04-28 MED ORDER — PANTOPRAZOLE SODIUM 40 MG PO TBEC
40.0000 mg | DELAYED_RELEASE_TABLET | Freq: Two times a day (BID) | ORAL | Status: DC
  Administered 2013-04-28 – 2013-04-29 (×2): 40 mg via ORAL
  Filled 2013-04-28 (×2): qty 1

## 2013-04-28 MED ORDER — PROPOFOL 10 MG/ML IV EMUL
INTRAVENOUS | Status: AC
  Filled 2013-04-28: qty 20

## 2013-04-28 MED ORDER — DEXAMETHASONE SODIUM PHOSPHATE 10 MG/ML IJ SOLN
INTRAMUSCULAR | Status: AC
  Filled 2013-04-28: qty 1

## 2013-04-28 MED ORDER — NALOXONE HCL 0.4 MG/ML IJ SOLN
INTRAMUSCULAR | Status: DC | PRN
  Administered 2013-04-28: 40 ug via INTRAVENOUS

## 2013-04-28 MED ORDER — METOCLOPRAMIDE HCL 5 MG/ML IJ SOLN: 10.0000 mg | Freq: Once | INTRAMUSCULAR | Status: DC | PRN

## 2013-04-28 MED ORDER — HYDROMORPHONE HCL PF 1 MG/ML IJ SOLN
INTRAMUSCULAR | Status: AC
  Filled 2013-04-28: qty 1

## 2013-04-28 MED ORDER — FENTANYL CITRATE 0.05 MG/ML IJ SOLN: 25.0000 ug | INTRAMUSCULAR | Status: DC | PRN

## 2013-04-28 MED ORDER — HYDROMORPHONE HCL PF 1 MG/ML IJ SOLN: 0.5000 mg | INTRAMUSCULAR | Status: DC | PRN

## 2013-04-28 MED ORDER — GLYCOPYRROLATE 0.2 MG/ML IJ SOLN
INTRAMUSCULAR | Status: DC | PRN
  Administered 2013-04-28: 0.1 mg via INTRAVENOUS
  Administered 2013-04-28: .4 mg via INTRAVENOUS
  Administered 2013-04-28: 0.1 mg via INTRAVENOUS

## 2013-04-28 MED ORDER — MEPERIDINE HCL 25 MG/ML IJ SOLN: 6.2500 mg | INTRAMUSCULAR | Status: DC | PRN

## 2013-04-28 MED ORDER — HYDROMORPHONE HCL PF 1 MG/ML IJ SOLN
INTRAMUSCULAR | Status: DC | PRN
  Administered 2013-04-28 (×2): 1 mg via INTRAVENOUS

## 2013-04-28 MED ORDER — FENTANYL CITRATE 0.05 MG/ML IJ SOLN
INTRAMUSCULAR | Status: AC
  Filled 2013-04-28: qty 2

## 2013-04-28 MED ORDER — SODIUM CHLORIDE 0.9 % IJ SOLN
INTRAMUSCULAR | Status: AC
  Filled 2013-04-28: qty 10

## 2013-04-28 MED ORDER — ROPIVACAINE HCL 5 MG/ML IJ SOLN
INTRAMUSCULAR | Status: AC
  Filled 2013-04-28: qty 60

## 2013-04-28 MED ORDER — VASOPRESSIN 20 UNIT/ML IJ SOLN
INTRAMUSCULAR | Status: AC
  Filled 2013-04-28: qty 1

## 2013-04-28 MED ORDER — ALBUTEROL SULFATE HFA 108 (90 BASE) MCG/ACT IN AERS
INHALATION_SPRAY | RESPIRATORY_TRACT | Status: AC
  Filled 2013-04-28: qty 6.7

## 2013-04-28 MED ORDER — LACTATED RINGERS IV SOLN
INTRAVENOUS | Status: DC
  Administered 2013-04-28 (×3): via INTRAVENOUS

## 2013-04-28 MED ORDER — ONDANSETRON HCL 4 MG/2ML IJ SOLN
4.0000 mg | Freq: Four times a day (QID) | INTRAMUSCULAR | Status: DC | PRN
  Administered 2013-04-28: 4 mg via INTRAVENOUS
  Filled 2013-04-28: qty 2

## 2013-04-28 MED ORDER — ALBUTEROL SULFATE HFA 108 (90 BASE) MCG/ACT IN AERS
INHALATION_SPRAY | RESPIRATORY_TRACT | Status: DC | PRN
  Administered 2013-04-28: 3 via RESPIRATORY_TRACT

## 2013-04-28 MED ORDER — KETOROLAC TROMETHAMINE 10 MG PO TABS
10.0000 mg | ORAL_TABLET | Freq: Four times a day (QID) | ORAL | Status: DC | PRN
  Administered 2013-04-29: 10 mg via ORAL
  Filled 2013-04-28: qty 1

## 2013-04-28 MED ORDER — NALOXONE HCL 0.4 MG/ML IJ SOLN
INTRAMUSCULAR | Status: AC
  Filled 2013-04-28: qty 1

## 2013-04-28 MED ORDER — FENTANYL CITRATE 0.05 MG/ML IJ SOLN
INTRAMUSCULAR | Status: DC | PRN
  Administered 2013-04-28: 100 ug via INTRAVENOUS
  Administered 2013-04-28 (×4): 50 ug via INTRAVENOUS

## 2013-04-28 MED ORDER — KETOROLAC TROMETHAMINE 30 MG/ML IJ SOLN: 15.0000 mg | Freq: Once | INTRAMUSCULAR | Status: DC | PRN

## 2013-04-28 MED ORDER — ACETAMINOPHEN 325 MG PO TABS: 650.0000 mg | ORAL_TABLET | ORAL | Status: DC | PRN

## 2013-04-28 MED ORDER — GLYCOPYRROLATE 0.2 MG/ML IJ SOLN
INTRAMUSCULAR | Status: AC
  Filled 2013-04-28: qty 3

## 2013-04-28 MED ORDER — PROPOFOL 10 MG/ML IV BOLUS
INTRAVENOUS | Status: DC | PRN
  Administered 2013-04-28: 200 mg via INTRAVENOUS

## 2013-04-28 MED ORDER — DEXAMETHASONE SODIUM PHOSPHATE 10 MG/ML IJ SOLN
INTRAMUSCULAR | Status: DC | PRN
  Administered 2013-04-28: 10 mg via INTRAVENOUS

## 2013-04-28 MED ORDER — ROPIVACAINE HCL 5 MG/ML IJ SOLN
INTRAMUSCULAR | Status: DC | PRN
  Administered 2013-04-28: 120 mL

## 2013-04-28 MED ORDER — NEOSTIGMINE METHYLSULFATE 1 MG/ML IJ SOLN
INTRAMUSCULAR | Status: DC | PRN
  Administered 2013-04-28: 3 mg via INTRAVENOUS

## 2013-04-28 MED ORDER — NEOSTIGMINE METHYLSULFATE 1 MG/ML IJ SOLN
INTRAMUSCULAR | Status: AC
  Filled 2013-04-28: qty 1

## 2013-04-28 MED ORDER — KETOROLAC TROMETHAMINE 30 MG/ML IJ SOLN
INTRAMUSCULAR | Status: AC
  Filled 2013-04-28: qty 1

## 2013-04-28 MED ORDER — ROCURONIUM BROMIDE 100 MG/10ML IV SOLN
INTRAVENOUS | Status: DC | PRN
  Administered 2013-04-28: 50 mg via INTRAVENOUS

## 2013-04-28 MED ORDER — ACETAMINOPHEN 500 MG PO TABS: 500.0000 mg | ORAL_TABLET | Freq: Four times a day (QID) | ORAL | Status: DC | PRN

## 2013-04-28 MED ORDER — LIDOCAINE HCL (CARDIAC) 20 MG/ML IV SOLN
INTRAVENOUS | Status: AC
  Filled 2013-04-28: qty 5

## 2013-04-28 MED ORDER — SODIUM CHLORIDE 0.9 % IV SOLN
INTRAVENOUS | Status: DC | PRN
  Administered 2013-04-28: 11:00:00 via INTRAMUSCULAR

## 2013-04-28 MED ORDER — LACTATED RINGERS IV SOLN
INTRAVENOUS | Status: DC
  Administered 2013-04-28: 21:00:00 via INTRAVENOUS

## 2013-04-28 MED ORDER — KETOROLAC TROMETHAMINE 30 MG/ML IJ SOLN
INTRAMUSCULAR | Status: DC | PRN
  Administered 2013-04-28: 30 mg via INTRAVENOUS

## 2013-04-28 MED ORDER — ALUM & MAG HYDROXIDE-SIMETH 200-200-20 MG/5ML PO SUSP: 30.0000 mL | ORAL | Status: DC | PRN

## 2013-04-28 MED ORDER — SODIUM CHLORIDE 0.9 % IJ SOLN
INTRAMUSCULAR | Status: AC
  Filled 2013-04-28: qty 100

## 2013-04-28 MED ORDER — LIDOCAINE HCL (CARDIAC) 20 MG/ML IV SOLN
INTRAVENOUS | Status: DC | PRN
  Administered 2013-04-28: 80 mg via INTRAVENOUS

## 2013-04-28 MED ORDER — FENTANYL CITRATE 0.05 MG/ML IJ SOLN
INTRAMUSCULAR | Status: AC
  Filled 2013-04-28: qty 5

## 2013-04-28 MED ORDER — KETOROLAC TROMETHAMINE 30 MG/ML IJ SOLN: 30.0000 mg | Freq: Four times a day (QID) | INTRAMUSCULAR | Status: DC

## 2013-04-28 MED ORDER — ONDANSETRON HCL 4 MG/2ML IJ SOLN
INTRAMUSCULAR | Status: DC | PRN
  Administered 2013-04-28 (×2): 2 mg via INTRAVENOUS

## 2013-04-28 MED ORDER — SODIUM CHLORIDE 0.9 % IJ SOLN
INTRAMUSCULAR | Status: AC
  Filled 2013-04-28: qty 50

## 2013-04-28 SURGICAL SUPPLY — 50 items
ADH SKN CLS APL DERMABOND .7 (GAUZE/BANDAGES/DRESSINGS) ×2
APL SKNCLS STERI-STRIP NONHPOA (GAUZE/BANDAGES/DRESSINGS)
APPLICATOR COTTON TIP 6IN STRL (MISCELLANEOUS) ×4 IMPLANT
BARRIER ADHS 3X4 INTERCEED (GAUZE/BANDAGES/DRESSINGS) IMPLANT
BENZOIN TINCTURE PRP APPL 2/3 (GAUZE/BANDAGES/DRESSINGS) ×2 IMPLANT
BRR ADH 4X3 ABS CNTRL BYND (GAUZE/BANDAGES/DRESSINGS)
CHLORAPREP W/TINT 26ML (MISCELLANEOUS) ×4 IMPLANT
CLOSURE WOUND 1/4X4 (GAUZE/BANDAGES/DRESSINGS)
CLOTH BEACON ORANGE TIMEOUT ST (SAFETY) ×4 IMPLANT
CONT PATH 16OZ SNAP LID 3702 (MISCELLANEOUS) ×4 IMPLANT
COVER TABLE BACK 60X90 (DRAPES) ×4 IMPLANT
DECANTER SPIKE VIAL GLASS SM (MISCELLANEOUS) ×12 IMPLANT
DERMABOND ADVANCED (GAUZE/BANDAGES/DRESSINGS) ×2
DERMABOND ADVANCED .7 DNX12 (GAUZE/BANDAGES/DRESSINGS) ×2 IMPLANT
ELECT LIGASURE LONG (ELECTRODE) IMPLANT
ELECT LIGASURE SHORT 9 REUSE (ELECTRODE) ×2 IMPLANT
ELECT REM PT RETURN 9FT ADLT (ELECTROSURGICAL) ×4
ELECTRODE REM PT RTRN 9FT ADLT (ELECTROSURGICAL) IMPLANT
FORCEPS CUTTING 33CM 5MM (CUTTING FORCEPS) IMPLANT
GAUZE PACKING 2X5 YD STERILE (GAUZE/BANDAGES/DRESSINGS) ×2 IMPLANT
GLOVE BIOGEL M 6.5 STRL (GLOVE) ×16 IMPLANT
GLOVE BIOGEL PI IND STRL 7.0 (GLOVE) ×4 IMPLANT
GLOVE BIOGEL PI INDICATOR 7.0 (GLOVE) ×4
GLOVE ECLIPSE 6.0 STRL STRAW (GLOVE) ×4 IMPLANT
GOWN STRL REUS W/TWL LRG LVL3 (GOWN DISPOSABLE) ×20 IMPLANT
MANIPULATOR UTERINE 4.5 ZUMI (MISCELLANEOUS) IMPLANT
NS IRRIG 1000ML POUR BTL (IV SOLUTION) ×4 IMPLANT
PACK LAVH (CUSTOM PROCEDURE TRAY) ×4 IMPLANT
PROTECTOR NERVE ULNAR (MISCELLANEOUS) ×6 IMPLANT
SET IRRIG TUBING LAPAROSCOPIC (IRRIGATION / IRRIGATOR) ×2 IMPLANT
SOLUTION ELECTROLUBE (MISCELLANEOUS) IMPLANT
STRIP CLOSURE SKIN 1/4X4 (GAUZE/BANDAGES/DRESSINGS) ×2 IMPLANT
SUT CHROMIC 2 0 SH (SUTURE) IMPLANT
SUT CHROMIC 2 0 UR 5 27 (SUTURE) IMPLANT
SUT MNCRL AB 4-0 PS2 18 (SUTURE) ×2 IMPLANT
SUT VIC AB 0 CT1 18XCR BRD8 (SUTURE) ×4 IMPLANT
SUT VIC AB 0 CT1 36 (SUTURE) ×4 IMPLANT
SUT VIC AB 0 CT1 8-18 (SUTURE) ×8
SUT VIC AB 2-0 CT1 (SUTURE) ×4 IMPLANT
SUT VICRYL 0 UR6 27IN ABS (SUTURE) ×4 IMPLANT
SUT VICRYL 1 TIES 12X18 (SUTURE) ×4 IMPLANT
SUT VICRYL 4-0 PS2 18IN ABS (SUTURE) ×4 IMPLANT
SYR 30ML LL (SYRINGE) ×2 IMPLANT
TOWEL OR 17X24 6PK STRL BLUE (TOWEL DISPOSABLE) ×10 IMPLANT
TRAY FOLEY CATH 14FR (SET/KITS/TRAYS/PACK) ×4 IMPLANT
TROCAR XCEL NON-BLD 11X100MML (ENDOMECHANICALS) ×3 IMPLANT
TROCAR XCEL NON-BLD 5MMX100MML (ENDOMECHANICALS) ×6 IMPLANT
TROCAR XCEL OPT SLVE 5M 100M (ENDOMECHANICALS) ×4 IMPLANT
WARMER LAPAROSCOPE (MISCELLANEOUS) ×2 IMPLANT
WATER STERILE IRR 1000ML POUR (IV SOLUTION) ×2 IMPLANT

## 2013-04-28 NOTE — Brief Op Note (Signed)
04/28/2013  1:43 PM  PATIENT:  Allison Davies  43 y.o. female  PRE-OPERATIVE DIAGNOSIS:  Complex Endometrial Hyperplasia w/out Atypia, Pelvic Pain, Vulvar itching  POST-OPERATIVE DIAGNOSIS:  Same   PROCEDURE:  Procedure(s): LAPAROSCOPIC ASSISTED VAGINAL HYSTERECTOMY (N/A) VULVAR BIOPSY (N/A)  SURGEON:  Surgeon(s) and Role:    * Thurnell Lose, MD - Primary    * Annalee Genta, DO - Assisting  PHYSICIAN ASSISTANT: Dr. Nelda Marseille  ASSISTANTS: Technicians   ANESTHESIA:   local and general  EBL:  Total I/O In: 2000 [I.V.:2000] Out: 450 [Urine:150; Blood:300]  BLOOD ADMINISTERED:none  DRAINS: Urinary Catheter (Foley)   LOCAL MEDICATIONS USED:  OTHER Ropivacaine, Vasopressin 20/100 NS  SPECIMEN:  Source of Specimen:  Uterus with cervix, right labial punch biopsy, perianal mass  DISPOSITION OF SPECIMEN:  PATHOLOGY  COUNTS:  YES  TOURNIQUET:  * No tourniquets in log *   DICTATION: .Other Dictation: Dictation Number [id number]  PLAN OF CARE: Admit for overnight observation  PATIENT DISPOSITION:  PACU - hemodynamically stable.   Delay start of Pharmacological VTE agent (>24hrs) due to surgical blood loss or risk of bleeding: yes

## 2013-04-28 NOTE — Transfer of Care (Signed)
Immediate Anesthesia Transfer of Care Note  Patient: Allison Davies  Procedure(s) Performed: Procedure(s): LAPAROSCOPIC ASSISTED VAGINAL HYSTERECTOMY (N/A) VULVAR BIOPSY (N/A)  Patient Location: PACU  Anesthesia Type:General  Level of Consciousness: awake, alert  and oriented  Airway & Oxygen Therapy: Patient Spontanous Breathing and Patient connected to nasal cannula oxygen  Post-op Assessment: Report given to PACU RN, Post -op Vital signs reviewed and stable and Patient moving all extremities X 4  Post vital signs: Reviewed and stable  Complications: No apparent anesthesia complications

## 2013-04-28 NOTE — Interval H&P Note (Signed)
History and Physical Interval Note:  04/28/2013 10:03 AM  Allison Davies  has presented today for surgery, with the diagnosis of Endometrial Hyperplasia with Pelvic Pain Dr. Simona Huh requests adding  79ml Robivicaine to 67ml  NACL to her card same as Dr. Landry Mellow uses for her robotic hysterectomy.Questions? call [phone]  The various methods of treatment have been discussed with the patient and family. After consideration of risks, benefits and other options for treatment, the patient has consented to  Procedure(s) with comments: [place] (N/A) - Dr.Varnardo requests 70ml Robivicaine to 74ml NACL be added to her LAVH card same as Dr. Landry Mellow uses for her Robotic Hysterectomy Any questions call [phone] as a surgical intervention .  The patient's history has been reviewed, patient examined, no change in status, stable for surgery.  I have reviewed the patient's chart and labs.  Questions were answered to the patient's satisfaction.     Simona Huh, Bakari Nikolai

## 2013-04-28 NOTE — Anesthesia Postprocedure Evaluation (Signed)
Anesthesia Post Note  Patient: Allison Davies  Procedure(s) Performed: Procedure(s) (LRB): LAPAROSCOPIC ASSISTED VAGINAL HYSTERECTOMY (N/A) VULVAR BIOPSY (N/A)  Anesthesia type: General  Patient location: PACU  Post pain: Pain level controlled  Post assessment: Post-op Vital signs reviewed  Last Vitals:  Filed Vitals:   04/28/13 1528  BP: 96/46  Pulse: 89  Temp: 37 C  Resp: 13    Post vital signs: Reviewed  Level of consciousness: sedated  Complications: No apparent anesthesia complications

## 2013-04-28 NOTE — Anesthesia Preprocedure Evaluation (Addendum)
Anesthesia Evaluation  Patient identified by MRN, date of birth, ID band Patient awake    Reviewed: Allergy & Precautions, H&P , Patient's Chart, lab work & pertinent test results, reviewed documented beta blocker date and time   History of Anesthesia Complications Negative for: history of anesthetic complications  Airway Mallampati: II TM Distance: >3 FB Neck ROM: full    Dental   Pulmonary asthma , former smoker,  breath sounds clear to auscultation        Cardiovascular Exercise Tolerance: Good Rhythm:regular Rate:Normal     Neuro/Psych  Neuromuscular disease    GI/Hepatic hiatal hernia, GERD-  Controlled,  Endo/Other    Renal/GU      Musculoskeletal   Abdominal   Peds  Hematology   Anesthesia Other Findings Lupus with neck stiffness  Reproductive/Obstetrics                          Anesthesia Physical Anesthesia Plan  ASA: III  Anesthesia Plan: General ETT   Post-op Pain Management:    Induction: Intravenous  Airway Management Planned: Oral ETT and Video Laryngoscope Planned  Additional Equipment:   Intra-op Plan:   Post-operative Plan:   Informed Consent: I have reviewed the patients History and Physical, chart, labs and discussed the procedure including the risks, benefits and alternatives for the proposed anesthesia with the patient or authorized representative who has indicated his/her understanding and acceptance.   Dental Advisory Given  Plan Discussed with: CRNA and Surgeon  Anesthesia Plan Comments:        Anesthesia Quick Evaluation

## 2013-04-28 NOTE — H&P (Addendum)
History of Present Illness  General:  43 y/o G3P3 presents for preop for LAVH due to complex endometrial hyperplasia w/o atypia. Pt did not have complete resolution after 3 months of Provera and decided she wanted definitive management. She is also have irregular bleeding and pelvic pain. Pt is s/p multiple abdominal procedures including bilateral salpingectomy and appendectomy. Pt is allergic to several medications. Can take Toradol. Has never had Dilaudid. Has had the best pain control with Toradol. Has headache with Tramadol, confusion with Darvocet. C/o headache and fatigue since this am. Exposed to pt with Influenza A & B. Felt flush but has not had a fever. Pt had heavy bleeding with Tumeric ~ 3 weeks ago. Once she discontinued it bleeding resolved. Still taking Provera 20 mg daily and having spotting. Vulvar irritation has not improved much s/p Diflucan.   Current Medications  Taking   Meloxicam 15 MG Tablet 1 tablet Once a day   Multi Vitamin/Minerals Tablet   Provera 10 MG Tablet 2 tablets Once a day   Vitamin D 1000 UNIT Tablet 1 tablet Once a day   Flonase 50 MCG/ACT ````````````````````````` Suspension 2 sprays in each nostril Once a day in the am   Not-Taking/PRN   Flonase 50 MCG/ACT Suspension 2 sprays Once a day in am, Notes: prn fluid in ears   Loratadine 10 MG Tablet 1 tablet Once a day as needed, Notes: seaonal   ProAir HFA 108 (90 Base) MCG/ACT Aerosol Solution 1 puff as needed every 4 hrs, Notes: prn   Pantoprazole Sodium 40 MG Tablet Delayed Release 1 tablet twice a day as needed, Notes: prn   Discontinued   Zithromax Z-pak 250 MG Tablet 2 p.o. the 1st day, then 1 p.o. q.d. for 4 da   Medication List reviewed and reconciled with the patient    Past Medical History  Depression  Type 2 diabetes, diet controlled  GERD  Panic attacks  Systemic Lupus at age 46  Complex endometrial hyperplasia w/o atypia   Surgical History  appendectomy   Exploratory laparoscopy    Bilateral salpingectomy due to ectopic 1995  Endometrial ablation - Dr. Julien Girt 10/2012   Family History  Father: alive 14 yrs, diagnosed with DM, HTN  Mother: alive 75 yrs, Uterine cancer, IBS, diagnosed with DM  Maternal Grand Father: leukemia, diagnosed with DM, CAD  Maternal Grand Mother: ., diagnosed with DM, CAD, Lung Ca  Brother 1: alive 55 yrs, diagnosed with CVA  Brother2: alive 79 yrs  Paternal aunt: ., diagnosed with Breast Ca  2 brother(s) .    Social History  General:  Tobacco use  cigarettes: Former smoker Quit in year 2006 Pack-year Hx: 10 Tobacco history last updated 04/21/2013 no Alcohol.  no Recreational drug use.  Diet: low fat.  Exercise: walks occasionally.  Occupation: works at [employer] at [employer].  Marital Status: married.  Children: girls, 3.    Gyn History  Periods : irregular bleeding.  LMP bleeding now.  Birth control tubes removed.  Last pap smear date 10/2012.  Last mammogram date 2014.  Denies H/O Abnormal pap smear.  Denies H/O STD.  GYN procedures 01/20/13, Endometrial Biopsy, hyperplasia.    OB History  Number of pregnancies 3.  Pregnancy # 1 live birth, vaginal delivery.  Pregnancy # 2 live birth, vaginal delivery.  Pregnancy # 3 live birth, vaginal delivery.    Allergies  Compazine: anaphylaxis: Allergy  Lemon extract: anaphylaxis: Allergy  Plaquenil: nausea, rapid heart rate: Side Effects  Penicillin V Potassium:  hives, severe nausea  Codeine Sulfate: hives, severe nausea  Midrin: severe headache  Avelox: rash, severe nausea  Darvocet-N 100: confusion  Paxil: ineffective  Tramadol HCl: severe headache  Methotrexate: severe nausea   Hospitalization/Major Diagnostic Procedure  Denies Past Hospitalization   Review of Systems  Denies fever/chills, chest pain, SOB, headaches, numbness/tingling. No h/o complication with anesthesia, bleeding disorders or blood clots Denies fever/chills, chest pain, SOB, headaches,  numbness/tingling. No h/o complication with anesthesia, bleeding disorders or blood clots.   Vital Signs  Wt 189, Wt change 1.5 lb, Ht 66.75, BMI 29.82, Temp 98.1, Pulse sitting 90, BP sitting 119/75.   Physical Examination  GENERAL:  Patient appears alert and oriented.  General Appearance: well-appearing, well-developed, no acute distress.  Speech: clear.  LUNGS:  Auscultation: no wheezing/rhonchi/rales. CTA bilaterally.  HEART:  Heart sounds: normal. RRR. no murmur.  ABDOMEN:  General: soft nontender, nondistended, no masses.  FEMALE GENITOURINARY:  Pelvic Vagina without bleeding, no abnormal discharge, min-moderate descent of uterus, moderate tenderness of uterus, no adnexal masses, uterus mobile. Pallor of labia bilaterally, no excoriations or chaffing. EXTREMITIES:  General: No edema or calf tenderness.     Assessments   1. Pre-op exam - V72.84 (Primary)   2. Complex endometrial hyperplasia without atypia - 621.32   3. Abnormal uterine bleeding - 626.9   4. Pelvic pain in female - 625.9   Treatment  1. Pre-op exam  Notes: R/B/A of procedure discussed with pt at length. All questions answered. Consent obtained. Pt was shown diagrams anatomy reviewed. Risk of bowel and or bladder injury and ureteral injury discussed. Pt given the option to manage endometrial hyperplasia medically previously but she desires to proceed with surgery.    2. Vulvar itching-  Punch biopsy at time of surgery to rule out lichen sclerosus.  Follow Up  2 Weeks post op

## 2013-04-29 ENCOUNTER — Encounter (HOSPITAL_COMMUNITY): Payer: Self-pay | Admitting: Obstetrics and Gynecology

## 2013-04-29 LAB — BASIC METABOLIC PANEL
BUN: 10 mg/dL (ref 6–23)
CALCIUM: 8.1 mg/dL — AB (ref 8.4–10.5)
CO2: 21 mEq/L (ref 19–32)
Chloride: 107 mEq/L (ref 96–112)
Creatinine, Ser: 0.68 mg/dL (ref 0.50–1.10)
GLUCOSE: 123 mg/dL — AB (ref 70–99)
Potassium: 4.2 mEq/L (ref 3.7–5.3)
Sodium: 140 mEq/L (ref 137–147)

## 2013-04-29 LAB — CBC
HCT: 34.5 % — ABNORMAL LOW (ref 36.0–46.0)
HEMOGLOBIN: 11.7 g/dL — AB (ref 12.0–15.0)
MCH: 32.2 pg (ref 26.0–34.0)
MCHC: 33.9 g/dL (ref 30.0–36.0)
MCV: 95 fL (ref 78.0–100.0)
Platelets: 198 10*3/uL (ref 150–400)
RBC: 3.63 MIL/uL — ABNORMAL LOW (ref 3.87–5.11)
RDW: 14.5 % (ref 11.5–15.5)
WBC: 16.8 10*3/uL — ABNORMAL HIGH (ref 4.0–10.5)

## 2013-04-29 MED ORDER — KETOROLAC TROMETHAMINE 10 MG PO TABS
ORAL_TABLET | ORAL | Status: DC
Start: 2013-04-29 — End: 2014-06-29

## 2013-04-29 MED ORDER — HYDROMORPHONE HCL 2 MG PO TABS
ORAL_TABLET | ORAL | Status: DC
Start: 2013-04-29 — End: 2014-06-29

## 2013-04-29 NOTE — Anesthesia Postprocedure Evaluation (Signed)
  Anesthesia Post-op Note  Patient: Allison Davies  Procedure(s) Performed: Procedure(s): LAPAROSCOPIC ASSISTED VAGINAL HYSTERECTOMY (N/A) VULVAR BIOPSY (N/A)  Patient Location: PACU and Women's Unit  Anesthesia Type:General  Level of Consciousness: awake, alert  and oriented  Airway and Oxygen Therapy: Patient Spontanous Breathing  Post-op Pain: mild  Post-op Assessment: Patient's Cardiovascular Status Stable, Respiratory Function Stable, No signs of Nausea or vomiting, Adequate PO intake and Pain level controlled  Post-op Vital Signs: stable  Complications: No apparent anesthesia complications

## 2013-04-29 NOTE — Discharge Instructions (Signed)
Laparoscopically Assisted Vaginal Hysterectomy  A laparoscopically assisted vaginal hysterectomy (LAVH) is a surgical procedure to remove the uterus and cervix, and sometimes the ovaries and fallopian tubes. During an LAVH, some of the surgical removal is done through the vagina, and the rest is done through a few small surgical cuts (incisions) in the abdomen.  This procedure is usually considered in women when a vaginal hysterectomy is not an option. Your health care provider will discuss the risks and benefits of the different surgical techniques at your appointment. Generally, recovery time is faster and there are fewer complications after laparoscopic procedures than after open incisional procedures. LET YOUR HEALTH CARE PROVIDER KNOW ABOUT:   Any allergies you have.  All medicines you are taking, including vitamins, herbs, eye drops, creams, and over-the-counter medicines.  Previous problems you or members of your family have had with the use of anesthetics.  Any blood disorders you have.  Previous surgeries you have had.  Medical conditions you have. RISKS AND COMPLICATIONS Generally, this is a safe procedure. However, as with any procedure, complications can occur. Possible complications include:  Allergies to medicines.  Difficulty breathing.  Bleeding.  Infection.  Damage to other structures near your uterus and cervix. BEFORE THE PROCEDURE  Ask your health care provider about changing or stopping your regular medicines.  Take certain medicines, such as a colon-emptying preparation, as directed.  Do not eat or drink anything for at least 8 hours before your surgery.  Stop smoking if you smoke. Stopping will improve your health after surgery.  Arrange for a ride home after surgery and for help at home during recovery. PROCEDURE   An IV tube will be put into one of your veins in order to give you fluids and medicines.  You will receive medicines to relax you and  medicines that make you sleep (general anesthetic).  You may have a flexible tube (catheter) put into your bladder to drain urine.  You may have a tube put through your nose or mouth that goes into your stomach (nasogastric tube). The nasogastric tube removes digestive fluids and prevents you from feeling nauseated and from vomiting.  Tight-fitting (compression) stockings will be placed on your legs to promote circulation.  Three to four small incisions will be made in your abdomen. An incision also will be made in your vagina. Probes and tools will be inserted into the small incisions. The uterus and cervix are removed (and possibly your ovaries and fallopian tubes) through your vagina as well as through the small incisions that were made in the abdomen.  Your vagina is then sewn back to normal. AFTER THE PROCEDURE  You may have a liquid diet temporarily. You will most likely return to, and tolerate, your usual diet the day after surgery.  You will be passing urine through a catheter. It will be removed the day after surgery.  Your temperature, breathing rate, heart rate, blood pressure, and oxygen level will be monitored regularly.  You will still wear compression stockings on your legs until you are able to move around.  You will use a special device or do breathing exercises to keep your lungs clear.  You will be encouraged to walk as soon as possible. Document Released: 01/18/2011 Document Revised: 10/01/2012 Document Reviewed: 08/14/2012 Exit[employer] Patient Information 2014 [employer].  

## 2013-04-29 NOTE — Discharge Summary (Signed)
Physician Discharge Summary  Patient ID: Allison Davies MRN: [id number] DOB/AGE: 1970/11/11 43 y.o.  Admit date: 04/28/2013 Discharge date: 04/29/2013  Admission Diagnoses: Complex endometrial hyperplasia without atypia, Pelvic pain, Vulvar itching  Discharge Diagnoses: Same, adhesions Active Problems:   S/P laparoscopic assisted vaginal hysterectomy (LAVH)   Discharged Condition: good  Hospital Course: Pt did well postop.  Only needed scheduled Toradol for pain.  No N/V.    Consults: None  Significant Diagnostic Studies: labs: Post op hg 11.7, Cr. 0.6  Treatments: surgery: LAVH, vulvar biopsy  Discharge Exam: Blood pressure 93/52, pulse 74, temperature 98.4 F (36.9 C), temperature source Oral, resp. rate 16, height 5\' 6"  (1.676 m), weight 84.539 kg (186 lb 6 oz), SpO2 97.00%. General appearance: alert, cooperative and no distress GI: soft, non-tender; bowel sounds normal; no masses,  no organomegaly Extremities: extremities normal, atraumatic, no cyanosis or edema Incisions intact and dry.  Disposition: 01-Home or Self Care     Medication List         acetaminophen 500 MG tablet  Commonly known as:  TYLENOL  Take 500 mg by mouth every 6 (six) hours as needed for moderate pain.     cholecalciferol 1000 UNITS tablet  Commonly known as:  VITAMIN D  Take 1,000 Units by mouth daily.     FLINSTONES GUMMIES OMEGA-3 DHA Chew  Chew by mouth. Pt. States it the Complete Gummies     HYDROmorphone 2 MG tablet  Commonly known as:  DILAUDID  Take 1/2 tablet to 1 tablet every 4 hours as needed for severe pain.     ketorolac 10 MG tablet  Commonly known as:  TORADOL  1 tablet every 6 hours as needed for pain.  DISCONTINUE USE AFTER 05/02/2013.     meloxicam 15 MG tablet  Commonly known as:  MOBIC  Take 15 mg by mouth at bedtime.     OVER THE COUNTER MEDICATION  Apply 1 application topically as needed (for joint pain). 77 old Goats, pt mail orders, uses lotion as needed  for joint pain     OVER THE COUNTER MEDICATION  Apply 1 application topically daily. Neutrogena  Ultra Sheer sunblock , uses everyday due to her Lupus     pantoprazole 40 MG tablet  Commonly known as:  PROTONIX  Take 1 tablet (40 mg total) by mouth 2 (two) times daily.           Follow-up Information   Follow up with Thurnell Lose, MD. Schedule an appointment as soon as possible for a visit in 2 weeks. (Post op check)    Specialty:  Obstetrics and Gynecology   Contact information:   [address] [place] [address] [phone]       Signed: Thurnell Lose 04/29/2013, 8:28 AM

## 2013-04-29 NOTE — Op Note (Signed)
Allison Davies, Allison Davies                ACCOUNT NO.:  [id number]  MEDICAL RECORD NO.:  [id number]  LOCATION:  9303                          FACILITY:  [facility]  PHYSICIAN:  Jola Schmidt, MD   DATE OF BIRTH:  November 13, 1970  DATE OF PROCEDURE:  04/28/2013 DATE OF DISCHARGE:                              OPERATIVE REPORT   PREOPERATIVE DIAGNOSIS:  Complex endometrial hyperplasia without atypia, pelvic pain, and vulvar itching.  POSTOPERATIVE DIAGNOSIS:  Complex endometrial hyperplasia without atypia, pelvic pain, and vulvar itching.  PROCEDURE:  Laparoscopic-assisted vaginal hysterectomy and vulvar biopsy.  SURGEON:  Jola Schmidt, MD.  ASSISTANT:  Dr. Erenest Blank and technician.  ANESTHESIA:  Local and general.  IV FLUIDS IN:  2000 mL.  URINE OUTPUT:  150 mL of clear urine.  ESTIMATED BLOOD LOSS:  300 mL.  BLOOD ADMINISTERED:  None.  DRAINS:  Foley catheter.  ANTIBIOTICS:  Local ropivacaine, vasopressin 20 and 100 normal saline.  SPECIMENS:  Uterus with cervix, right labial punch biopsy, and perianal mass.  DISPOSITION OF SPECIMEN:  Pathology.  COUNTS:  Correct.  DISPOSITION:  To the PACU hemodynamically stable.  COMPLICATIONS:  None.  FINDINGS:  Small uterus with a small fundal fibroid, and adhesions of the colon to the right lower quadrant abdominal wall.  Fallopian tubes were surgically removed.  Ureters were anterior right near the infundibulopelvic ligament or anterior to the infundibulopelvic ligament.  The patient also had an enterocele, status post hysterectomy.  Vaginal prolapse and posterior cul-de-sac.  She also had a window over the posterior cul-de- sac.  Ovaries appeared to be normal bilaterally.  The liver edge was normal.  PROCEDURE IN DETAIL:  Ms. Reber was identified in the holding area.  She was then taken to the operating room with IV fluids running.  She was then transported to the operating room.  She was placed in the dorsal supine position  and underwent general endotracheal anesthesia without complication.  She was then placed in the dorsal lithotomy position and prepped and draped in a normal sterile fashion with Foley catheter put in place as well.  After a time-out was taken, the Graves speculum was inserted into the uterus.  The cervix was identified and grasped with a single-tooth tenaculum and a Hulka uterine manipulator was advanced easily.  All the Graves speculum within single-tooth tenaculum was then removed from the cervix.  Attention was turned to the abdomen, the patient had several previous laparoscopy, so I went above the umbilicus.  Ropivacaine was used to inject the subcutaneous space and skin.  A small 1 cm incision was made at the umbilicus.  Then, I entered the cavity with a 5-mm trocar and scope under direct visualization.  CO2 gas was then used to insufflate the abdomen.  We put in 2 other trocars 5-mm under direct visualization after transilluminating the abdomen, and premedicating with ropivacaine as well.  We then took pictures of the entire abdomen due to the patient's history of chronic pelvic pain and abdominal pain.  A 60 mL of ropivacaine was then placed into the abdomen.  Upon putting in the camera, we thought we saw the ureter peristalses over the infundibulopelvic.  There  were lots of engorged vessels on the sidewall and the ureter seemed to be engrossed in this mesh of vessels. We were not able to visualize the peristalsing, but we did see the other one on the right-hand side, and it was in a similar location and it clearly dove down into the pelvis and almost appeared to have a 90- degree angle.  The other one we determined did the same as well.  Because of the location of the ureters, we stayed very close on the uterus took down the round first and then the ovarian suspensory ligament.  The bladder flap was then developed and all of this was done with the Harmonic Scalpel.  Same  thing was done on the opposite side staying close to the uterus. On the right side, it appeared that it got into the uterine artery a little higher than it was anticipated probably inferior branch of the uterine, but that was caught.  We attempted to cauterize that with a harmonic and then the Kleppinger was able to slow the bleeding down.  I had put ropivacaine in the abdomen, but shortly after putting that due to the bleeding, we used  the suction irrigator and some of that was removed.  Prior to going below that side was hemostatic.  The patient's legs were placed directly cephalad and short-weighted speculum was placed in the vagina after the Hulka uterine manipulator was removed.  The cervix was grasped with two single-tooth tenaculum and there was a pericervical injection of the vasopressin.  The patient had good prolapse.  Once the anesthetic was placed, an incision was made with the Bovie circumferentially on the cervix.  I got into the posterior cul-de-sac and placed a long  duckbill speculum.  I attempted to get into the anterior, but was having difficulty because the vesicouterine peritoneum was high.  I grasped the Heaney clamps on both sides and got uterosacrals and tagged that with a 0 Vicryl stitch.  I then returned my attention to the anterior cul-de- sac and I was able to get in and the narrow Deaver was then placed.  I then used the LigaSure on both sides to get my uterine arteries.  The uterus was not coming down and there was just a few little ligaments that were cauterized with the LigaSure and the uterus was then removed easily.  Prior to removing the uterus, I needed to use the moistened 4 x 18 sponge to displace the bowel.  The right side appeared to be a little bit oozy afterwards and I cauterized that with LigaSure and then I put a small little stitch on the sidewall for hemostasis.  I placed my angle stitches using the uterosacral on the right-hand  side. Then, due to the prolapse of the vaginal vault, I did a McCall culdoplasty with a 0 Vicryl swedged on in the usual fashion.  I then held that, I then tacked my left-hand side, I tacked the uterosacrals for an angle stitch.  I then tied down my McCall culdoplasty stitch and I closed the cuff with 0 Vicryl in a running locked fashion.  Attention was then turned to the area of the labia, it was widened and thickened.  I used the punch biopsy approximately 4 mm and that was excised using the punch biopsy and then removed the specimen with the scissors, it was perianal tag that appeared to be similar to a condyloma that was excised using the knife.  These both areas were then cauterized with  the Bovie.  I then put in a moistened vaginal packing into the vagina.  Attention was then returned to the abdomen.  We went back in and looked.  The cuff was hemostatic and the bowel was well away from [facility] repair.  I took down the adhesions on that right-hand side, although it was little thickened.  I took down some of the filmy adhesions for a little bit more mobility.  The areas of the infundibulopelvic ligament did not appear as congested as previous after the uterus was removed.  All the lower trocars were removed.  They were removed under direct visualization.  I then left the umbilical trocar in to release all of the gas.  Expiratory breaths were given to release the gas as well.  The umbilical incision was then reapproximated in a subcuticular fashion with 4-0 Vicryl.  The subcuticular stitch x1 was used for the other incisions and they were also closed with Dermabond.  The patient was awakened in the OR, although it took her a little bit of time to come through or to wake up, and we can see anesthesia notes for any details.  She was taken to the recovery room in stable condition.     Jola Schmidt, MD     EBV/MEDQ  D:  04/28/2013  T:  04/29/2013  Job:  [id number]

## 2013-04-29 NOTE — Progress Notes (Signed)
Vaginal packing and foley cath removed as ordered.  Scant amount bloody drainage noted on packing.  Patient tolerated well.

## 2013-04-29 NOTE — Addendum Note (Signed)
Addendum created 04/29/13 0912 by Elenore Paddy, CRNA   Modules edited: Notes Section   Notes Section:  File: [id number]

## 2013-10-20 ENCOUNTER — Ambulatory Visit (INDEPENDENT_AMBULATORY_CARE_PROVIDER_SITE_OTHER): Payer: 59 | Admitting: Psychiatry

## 2013-10-20 ENCOUNTER — Encounter (HOSPITAL_COMMUNITY): Payer: Self-pay | Admitting: Psychiatry

## 2013-10-20 VITALS — BP 113/78 | HR 83 | Ht 66.0 in | Wt 187.0 lb

## 2013-10-20 DIAGNOSIS — F411 Generalized anxiety disorder: Secondary | ICD-10-CM

## 2013-10-20 MED ORDER — FLUOXETINE HCL 20 MG PO CAPS: 20.0000 mg | ORAL_CAPSULE | Freq: Every day | ORAL | Status: DC
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
## 2014-02-01 ENCOUNTER — Telehealth (HOSPITAL_COMMUNITY): Payer: Self-pay | Admitting: *Deleted

## 2014-02-01 NOTE — Telephone Encounter (Signed)
Patient left VM: Only one fluoxetine left. Called pharmacy to request, they said they have not heard from office [place] @ [phone] - they have not received a request from patient for med. They have refill on file and will get ready for opt  Phoned patient at [phone] - left message regarding RX availbale at [facility]

## 2014-02-16 ENCOUNTER — Ambulatory Visit (INDEPENDENT_AMBULATORY_CARE_PROVIDER_SITE_OTHER): Payer: 59 | Admitting: Psychiatry

## 2014-02-16 ENCOUNTER — Encounter (HOSPITAL_COMMUNITY): Payer: Self-pay | Admitting: Psychiatry

## 2014-02-16 VITALS — BP 124/80 | HR 76 | Ht 66.0 in | Wt 193.0 lb

## 2014-02-16 DIAGNOSIS — F4322 Adjustment disorder with anxiety: Secondary | ICD-10-CM

## 2014-02-16 MED ORDER — FLUOXETINE HCL 20 MG PO CAPS: 20.0000 mg | ORAL_CAPSULE | Freq: Every day | ORAL | Status: DC

## 2014-02-16 NOTE — Progress Notes (Signed)
[facility] [phone] Progress Note  Allison Davies [id number] 44 y.o.  02/16/2014 3:55 PM  Chief Complaint: anxiety is great  History of Present Illness: Pt has been able to take her meds and is doing "great". Pt is f/up with her Rheumatologist in Feb. States most of her anxiety was centered on taking Plaquenil and now that she is on it she is doing well.   Anxiety is gone. Denies racing thoughts, insomnia, GI upset, muscle tension. Denies panic attacks.  Mood is good and denies depression. Husband tells her she is more tolerable now. Denies isolation, anhedonia, crying spells, worthlessness and hopelessness. Sleep is really good. Energy, appetite and concentration are all good.   Taking Prozac as prescribed and denies SE.   Suicidal Ideation: No Plan Formed: No Patient has means to carry out plan: No  Homicidal Ideation: No Plan Formed: No Patient has means to carry out plan: No  Review of Systems: Psychiatric: Agitation: No Hallucination: No Depressed Mood: No Insomnia: No Hypersomnia: No Altered Concentration: No Feels Worthless: No Grandiose Ideas: No Belief In Special Powers: No New/Increased Substance Abuse: No Compulsions: No  Neurologic: Headache: No Seizure: No Paresthesias: No   Review of Systems  Constitutional: Negative for fever, chills and weight loss.  HENT: Negative for congestion, nosebleeds and sore throat.   Eyes: Positive for blurred vision. Negative for double vision and redness.  Respiratory: Negative for cough, sputum production and shortness of breath.   Cardiovascular: Negative for chest pain, palpitations and leg swelling.  Gastrointestinal: Negative for heartburn, nausea, vomiting and abdominal pain.  Musculoskeletal: Negative for myalgias, back pain, joint pain and neck pain.  Skin: Positive for rash. Negative for itching.  Neurological: Negative for dizziness, tingling, seizures, loss of consciousness and headaches.   Psychiatric/Behavioral: Negative for depression, suicidal ideas, hallucinations and substance abuse. The patient is not nervous/anxious and does not have insomnia.      Past Medical Family, Social History: [place], with husband and cats. Has 5 kids. 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  reports that she quit smoking about 10 years ago. Her smoking use included Cigarettes. She has a 40 pack-year smoking history. She has never used smokeless tobacco. She reports that she does not drink alcohol or use illicit drugs.  Family History  Problem Relation Age of Onset  . Colon cancer Neg Hx   . Suicidality Neg Hx   . Diabetes Mother   . Heart disease Mother   . Depression Mother   . Diabetes Father   . Depression Father   . Diabetes Maternal Grandfather   . Heart disease Maternal Grandfather   . Diabetes Maternal Grandmother   . Heart disease Maternal Grandmother   . Heart disease Brother   . Hypertension Brother   . Stroke Brother     Past Medical History  Diagnosis Date  . GERD (gastroesophageal reflux disease)   . Lupus   . Morphea   . Joint pain   . Connective tissue disease     Lupus  . Fatty liver   . Blood glucose abnormal   . Muscle spasm of back   . Hiatal hernia   . Asthma     rarely uses inhaler  . Anxiety     no meds, history of  . Depression      no meds, history of  . Sjogren's syndrome     Outpatient Encounter Prescriptions as of 02/16/2014  Medication Sig  . acetaminophen (TYLENOL) 500 MG tablet  Take 500 mg by mouth every 6 (six) hours as needed for moderate pain.   . cholecalciferol (VITAMIN D) 1000 UNITS tablet Take 1,000 Units by mouth daily.  Marland Kitchen FLUoxetine (PROZAC) 20 MG capsule Take 1 capsule (20 mg total) by mouth daily.  . fluticasone (FLONASE) 50 MCG/ACT nasal spray Place 1 spray into both nostrils daily.  . hydroxychloroquine (PLAQUENIL) 200 MG tablet Take 200 mg by mouth daily.  Marland Kitchen ibuprofen (ADVIL,MOTRIN) 200 MG tablet Take 400 mg by mouth  every morning.  . Multiple Vitamin (MULTIVITAMIN) capsule Take 1 capsule by mouth daily.  Marland Kitchen OVER THE COUNTER MEDICATION Apply 1 application topically as needed (for joint pain). 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 OVER THE COUNTER MEDICATION Apply 1 application topically daily. Neutrogena  Ultra Sheer sunblock , uses everyday due to her Lupus  . pantoprazole (PROTONIX) 40 MG tablet Take 1 tablet (40 mg total) by mouth 2 (two) times daily.  Marland Kitchen tiZANidine (ZANAFLEX) 2 MG tablet Take 2 mg by mouth every evening.  Marland Kitchen HYDROmorphone (DILAUDID) 2 MG tablet Take 1/2 tablet to 1 tablet every 4 hours as needed for severe pain. (Patient not taking: Reported on 02/16/2014)  . ketorolac (TORADOL) 10 MG tablet 1 tablet every 6 hours as needed for pain.  DISCONTINUE USE AFTER 05/02/2013. (Patient not taking: Reported on 02/16/2014)  . meloxicam (MOBIC) 15 MG tablet Take 15 mg by mouth at bedtime.   . Pediatric Multiple Vit-C-FA (FLINSTONES GUMMIES OMEGA-3 DHA) CHEW Chew by mouth. Pt. States it the Complete Gummies    Past Psychiatric History/Hospitalization(s): Anxiety: Yes Bipolar Disorder: No Depression: Yes Mania: No Psychosis: No Schizophrenia: No Personality Disorder: No Hospitalization for psychiatric illness: No History of Electroconvulsive Shock Therapy: No Prior Suicide Attempts: No  Physical Exam: Constitutional:  BP 124/80 mmHg  Pulse 76  Ht 5\' 6"  (1.676 m)  Wt 193 lb (87.544 kg)  BMI 31.17 kg/m2  General Appearance: alert, oriented, no acute distress and well nourished  Musculoskeletal: Strength & Muscle Tone: within normal limits Gait & Station: normal Patient leans: N/A  Mental Status Examination/Evaluation: Objective: Attitude: Calm and cooperative  Appearance: Casual, appears to be stated age  Eye Contact::  Good  Speech:  Clear and Coherent and Normal Rate  Volume:  Normal  Mood:  euthymic  Affect:  Full Range  Thought Process:  Goal  Directed, Linear and Logical  Orientation:  Full (Time, Place, and Person)  Thought Content:  Negative  Suicidal Thoughts:  No  Homicidal Thoughts:  No  Judgement:  Good  Insight:  Good  Concentration: good  Memory: Immediate-good Recent-good Remote-good  Recall: fair  Language: fair  Gait and Station: normal  [employer] of Knowledge: average  Psychomotor Activity:  Normal  Akathisia:  No  Handed:  Right  AIMS (if indicated):  n/a  Assets:  Armed forces logistics/support/administrative officer Desire for Improvement Financial Resources/Insurance Housing Intimacy Leisure Time Social Support       Medical Decision Making (Choose Three): Established Problem, Stable/Improving (1), Review of Psycho-Social Stressors (1) and Review of Medication Regimen & Side Effects (2)  Assessment: Anxiety seems to be related to her health stressors. Per her reports anxiety spikes with med changes and stressors with her health.   AXIS I Adjustment disorder with anxious mood vs anxiety due to medical illness  AXIS II Deferred  AXIS III Past Medical History  Diagnosis Date  . GERD (gastroesophageal reflux disease)   . Lupus   .  Morphea   . Joint pain   . Connective tissue disease     Lupus  . Fatty liver   . Blood glucose abnormal   . Muscle spasm of back   . Hiatal hernia   . Asthma     rarely uses inhaler  . Anxiety     no meds, history of  . Depression     no meds, history of  . Sjogren's syndrome      AXIS IV health stressors  AXIS V 51-60 moderate symptoms   Treatment Plan/Recommendations:  Plan of Care: Medication management with supportive therapy. Risks/benefits and SE of the medication discussed. Pt verbalized understanding and verbal consent obtained for treatment. Affirm with the patient that the medications are taken as ordered. Patient expressed understanding of how their medications were to be used.     Laboratory:  None today  Psychotherapy: Therapy: brief supportive therapy provided. Discussed psychosocial stressors in detail.    Medications: continue Prozac 20mg  po qD for anxiety  Routine PRN Medications: No  Consultations: none at this time  Safety Concerns: Pt denies SI and is at an acute low risk for suicide.Patient told to call clinic if any problems occur. Patient advised to go to ER if they should develop SI/HI, side effects, or if symptoms worsen. Has crisis numbers to call if needed. Pt verbalized understanding.   Other: F/up in 4 months or sooner if needed    Charlcie Cradle, MD 02/16/2014

## 2014-04-06 ENCOUNTER — Other Ambulatory Visit: Payer: Self-pay | Admitting: Internal Medicine

## 2014-04-06 MED ORDER — TRIAMCINOLONE ACETONIDE 0.1 % MT PSTE: 1.0000 "application " | PASTE | Freq: Two times a day (BID) | OROMUCOSAL | Status: DC

## 2014-05-11 ENCOUNTER — Ambulatory Visit
Admission: RE | Admit: 2014-05-11 | Discharge: 2014-05-11 | Disposition: A | Discharge status: Home / Self Care | Payer: 59 | Source: Ambulatory Visit

## 2014-05-11 ENCOUNTER — Other Ambulatory Visit: Payer: Self-pay

## 2014-05-11 DIAGNOSIS — Z1231 Encounter for screening mammogram for malignant neoplasm of breast: Secondary | ICD-10-CM

## 2014-06-17 ENCOUNTER — Ambulatory Visit (HOSPITAL_COMMUNITY): Payer: Self-pay | Admitting: Psychiatry

## 2014-06-29 ENCOUNTER — Encounter (HOSPITAL_COMMUNITY): Payer: Self-pay | Admitting: Psychiatry

## 2014-06-29 ENCOUNTER — Ambulatory Visit (INDEPENDENT_AMBULATORY_CARE_PROVIDER_SITE_OTHER): Payer: 59 | Admitting: Psychiatry

## 2014-06-29 VITALS — BP 122/73 | HR 83 | Ht 66.0 in | Wt 194.8 lb

## 2014-06-29 DIAGNOSIS — F4322 Adjustment disorder with anxiety: Secondary | ICD-10-CM

## 2014-06-29 MED ORDER — FLUOXETINE HCL 20 MG PO CAPS: 20.0000 mg | ORAL_CAPSULE | Freq: Every day | ORAL | Status: DC

## 2014-06-29 NOTE — Progress Notes (Signed)
Patient ID: Allison Davies, female   DOB: September 23, 1970, 44 y.o.   MRN: [id number]  [facility] Progress Note  Allison Davies [id number] 44 y.o.  06/29/2014 4:09 PM  Chief Complaint: doing ok  History of Present Illness: Pt has been doing well. She is irritated and feeling down due to some health stressors. She is a little tired of dealing with her numerous health problems.   Anxiety has resolved. Denies racing thoughts, insomnia, GI upset, muscle tension. Denies panic attacks.  Mood is good and denies depression. Husband continues to tell her she is more tolerable now. Denies isolation, anhedonia, crying spells, worthlessness and hopelessness.  Sleep is ok and she is getting about 6-7 hrs/night. Energy, appetite and concentration are all good.   Taking Prozac as prescribed and denies SE.   Suicidal Ideation: No Plan Formed: No Patient has means to carry out plan: No  Homicidal Ideation: No Plan Formed: No Patient has means to carry out plan: No  Review of Systems: Psychiatric: Agitation: No Hallucination: No Depressed Mood: No Insomnia: No Hypersomnia: No Altered Concentration: No Feels Worthless: No Grandiose Ideas: No Belief In Special Powers: No New/Increased Substance Abuse: No Compulsions: No  Neurologic: Headache: No Seizure: No Paresthesias: No   Review of Systems  Constitutional: Negative for fever, chills and weight loss.  HENT: Negative for congestion, ear pain and sore throat.   Eyes: Positive for blurred vision and pain. Negative for double vision.  Respiratory: Negative for cough, sputum production and shortness of breath.   Cardiovascular: Negative for chest pain, palpitations and leg swelling.  Gastrointestinal: Negative for heartburn, nausea, vomiting and abdominal pain.  Musculoskeletal: Positive for back pain and joint pain. Negative for neck pain.  Skin: Positive for rash. Negative for itching.  Neurological: Negative for  dizziness, sensory change, seizures, loss of consciousness and headaches.  Psychiatric/Behavioral: Negative for depression, suicidal ideas, hallucinations and substance abuse. The patient is not nervous/anxious and does not have insomnia.      Past Medical Family, Social History: [place], with husband and cats. Has 5 kids. 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  reports that she quit smoking about 11 years ago. Her smoking use included Cigarettes. She has a 40 pack-year smoking history. She has never used smokeless tobacco. She reports that she does not drink alcohol or use illicit drugs.  Family History  Problem Relation Age of Onset  . Colon cancer Neg Hx   . Suicidality Neg Hx   . Diabetes Mother   . Heart disease Mother   . Depression Mother   . Diabetes Father   . Depression Father   . Diabetes Maternal Grandfather   . Heart disease Maternal Grandfather   . Diabetes Maternal Grandmother   . Heart disease Maternal Grandmother   . Heart disease Brother   . Hypertension Brother   . Stroke Brother     Past Medical History  Diagnosis Date  . GERD (gastroesophageal reflux disease)   . Lupus   . Morphea   . Joint pain   . Connective tissue disease     Lupus  . Fatty liver   . Blood glucose abnormal   . Muscle spasm of back   . Hiatal hernia   . Asthma     rarely uses inhaler  . Anxiety     no meds, history of  . Depression      no meds, history of  . Sjogren's syndrome   . Plantar fascia  syndrome     Outpatient Encounter Prescriptions as of 06/29/2014  Medication Sig  . fluorometholone (FML) 0.1 % ophthalmic suspension 1 drop every 4 (four) hours.  Marland Kitchen FLUoxetine (PROZAC) 20 MG capsule Take 1 capsule (20 mg total) by mouth daily.  . fluticasone (FLONASE) 50 MCG/ACT nasal spray Place 1 spray into both nostrils daily.  . folic acid (FOLVITE) 1 MG tablet Take 1 mg by mouth daily.  . hydroxychloroquine (PLAQUENIL) 200 MG tablet Take 200 mg by mouth daily.  .  Methotrexate, PF, 7.5 MG/0.4ML SOAJ Inject into the skin.  . Multiple Vitamin (MULTIVITAMIN) capsule Take 1 capsule by mouth daily.  Marland Kitchen OVER THE COUNTER MEDICATION Apply 1 application topically as needed (for joint pain). 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 OVER THE COUNTER MEDICATION Apply 1 application topically daily. Neutrogena  Ultra Sheer sunblock , uses everyday due to her Lupus  . Pediatric Multiple Vit-C-FA (FLINSTONES GUMMIES OMEGA-3 DHA) CHEW Chew by mouth. Pt. States it the Complete Gummies  . tiZANidine (ZANAFLEX) 2 MG tablet Take 2 mg by mouth every evening.  Marland Kitchen acetaminophen (TYLENOL) 500 MG tablet Take 500 mg by mouth every 6 (six) hours as needed for moderate pain.   . cholecalciferol (VITAMIN D) 1000 UNITS tablet Take 1,000 Units by mouth daily.  Marland Kitchen ibuprofen (ADVIL,MOTRIN) 200 MG tablet Take 400 mg by mouth every morning.  . meloxicam (MOBIC) 15 MG tablet Take 15 mg by mouth at bedtime.   . pantoprazole (PROTONIX) 40 MG tablet Take 1 tablet (40 mg total) by mouth 2 (two) times daily.  Marland Kitchen triamcinolone (KENALOG) 0.1 % paste Use as directed 1 application in the mouth or throat 2 (two) times daily. (Patient not taking: Reported on 06/29/2014)  . [DISCONTINUED] HYDROmorphone (DILAUDID) 2 MG tablet Take 1/2 tablet to 1 tablet every 4 hours as needed for severe pain. (Patient not taking: Reported on 02/16/2014)  . [DISCONTINUED] ketorolac (TORADOL) 10 MG tablet 1 tablet every 6 hours as needed for pain.  DISCONTINUE USE AFTER 05/02/2013. (Patient not taking: Reported on 02/16/2014)   No facility-administered encounter medications on file as of 06/29/2014.    Past Psychiatric History/Hospitalization(s): Anxiety: Yes Bipolar Disorder: No Depression: Yes Mania: No Psychosis: No Schizophrenia: No Personality Disorder: No Hospitalization for psychiatric illness: No History of Electroconvulsive Shock Therapy: No Prior Suicide Attempts: No  Physical  Exam: Constitutional:  BP 122/73 mmHg  Pulse 83  Ht 5\' 6"  (1.676 m)  Wt 194 lb 12.8 oz (88.361 kg)  BMI 31.46 kg/m2  General Appearance: alert, oriented, no acute distress and well nourished  Musculoskeletal: Strength & Muscle Tone: within normal limits Gait & Station: normal Patient leans: N/A  Mental Status Examination/Evaluation: Objective: Attitude: Calm and cooperative  Appearance: Casual, appears to be stated age  Eye Contact::  Good  Speech:  Clear and Coherent and Normal Rate  Volume:  Normal  Mood:  euthymic  Affect:  Full Range  Thought Process:  Goal Directed, Linear and Logical  Orientation:  Full (Time, Place, and Person)  Thought Content:  Negative  Suicidal Thoughts:  No  Homicidal Thoughts:  No  Judgement:  Good  Insight:  Good  Concentration: good  Memory: Immediate-good Recent-good Remote-good  Recall: fair  Language: fair  Gait and Station: normal  [employer] of Knowledge: average  Psychomotor Activity:  Normal  Akathisia:  No  Handed:  Right  AIMS (if indicated):  n/a  Assets:  Communication Skills Desire  for Improvement Financial Resources/Insurance Housing Intimacy Leisure Time Social Support       Medical Decision Making (Choose Three): Established Problem, Stable/Improving (1), Review of Psycho-Social Stressors (1) and Review of Medication Regimen & Side Effects (2)  Assessment: Anxiety seems to be related to her health stressors. Per her reports anxiety spikes with med changes and stressors with her health.   AXIS I Adjustment disorder with anxious mood vs anxiety due to medical illness  AXIS II Deferred  AXIS III Past Medical History  Diagnosis Date  . GERD (gastroesophageal reflux disease)   . Lupus   . Morphea   . Joint pain   . Connective tissue disease     Lupus  . Fatty liver   . Blood glucose abnormal   . Muscle spasm of back   . Hiatal hernia   . Asthma     rarely  uses inhaler  . Anxiety     no meds, history of  . Depression     no meds, history of  . Sjogren's syndrome      AXIS IV health stressors  AXIS V 51-60 moderate symptoms   Treatment Plan/Recommendations:  Plan of Care: Medication management with supportive therapy. Risks/benefits and SE of the medication discussed. Pt verbalized understanding and verbal consent obtained for treatment. Affirm with the patient that the medications are taken as ordered. Patient expressed understanding of how their medications were to be used.     Laboratory: None today  Psychotherapy: Therapy: brief supportive therapy provided. Discussed psychosocial stressors in detail.    Medications: continue Prozac 20mg  po qD for anxiety  Routine PRN Medications: No  Consultations: none at this time  Safety Concerns: Pt denies SI and is at an acute low risk for suicide.Patient told to call clinic if any problems occur. Patient advised to go to ER if they should develop SI/HI, side effects, or if symptoms worsen. Has crisis numbers to call if needed. Pt verbalized understanding.   Other: F/up in 6 months or sooner if needed    Charlcie Cradle, MD 06/29/2014

## 2014-10-08 ENCOUNTER — Ambulatory Visit (HOSPITAL_COMMUNITY)
Admission: RE | Admit: 2014-10-08 | Discharge: 2014-10-08 | Disposition: A | Discharge status: Home / Self Care | Payer: 59 | Source: Ambulatory Visit | Attending: Otolaryngology | Admitting: Otolaryngology

## 2014-10-08 ENCOUNTER — Other Ambulatory Visit (HOSPITAL_COMMUNITY): Payer: Self-pay | Admitting: Otolaryngology

## 2014-10-08 DIAGNOSIS — M3500 Sicca syndrome, unspecified: Secondary | ICD-10-CM

## 2014-10-08 DIAGNOSIS — K118 Other diseases of salivary glands: Secondary | ICD-10-CM

## 2014-10-08 DIAGNOSIS — R591 Generalized enlarged lymph nodes: Secondary | ICD-10-CM | POA: Insufficient documentation

## 2014-10-08 DIAGNOSIS — R220 Localized swelling, mass and lump, head: Secondary | ICD-10-CM | POA: Insufficient documentation

## 2014-10-08 MED ORDER — IOHEXOL 300 MG/ML  SOLN
75.0000 mL | Freq: Once | INTRAMUSCULAR | Status: AC | PRN
  Administered 2014-10-08: 75 mL via INTRAVENOUS

## 2014-11-08 ENCOUNTER — Telehealth: Payer: Self-pay | Admitting: *Deleted

## 2014-11-08 NOTE — Telephone Encounter (Signed)
LVM for pt to call back and reschedule appt for Wednesday. Please offer 10am or other NP appt if she calls back. Thank you!

## 2014-11-08 NOTE — Telephone Encounter (Signed)
Called and left detailed VM for pt to call back regarding rescheduling appt for Wednesday at 3pm to either 10am or 230pm/other Zeena Starkel due to scheduling changes. Gave [facility] phone number for her to call back.

## 2014-11-09 NOTE — Telephone Encounter (Signed)
Pt called and says that she can not change and cannot change times due to work. It puts her in a bad situation at work. She is unsure when she would be able to take another day off. She is willing to see another physician  tomorrow if they are available. Please call and advise [phone]. May leave vm. She will be at work.

## 2014-11-09 NOTE — Telephone Encounter (Signed)
LVM for husband to call or pt to reschedule appt for tomorrow. Please offer 10am or 1115am same day if this works for her. Gave [facility] phone number and office hours.

## 2014-11-09 NOTE — Telephone Encounter (Signed)
Called and spoke w/ pt and offered appt with Dr. Leta Baptist at 330pm tomorrow instead. She said this will work. Told her to check in at 315pm. Apologized for any inconvenience. She verbalized understanding.

## 2014-11-09 NOTE — Telephone Encounter (Signed)
LVM for pt to call back to reschedule appt for tomorrow at 3pm due to Dr. Jaynee Eagles having scheduling changes. Gave [facility] phone number. Offered her a 10am and 1115am same day if those work for her. Asked her to call back to get her rescheduled.

## 2014-11-10 ENCOUNTER — Ambulatory Visit (INDEPENDENT_AMBULATORY_CARE_PROVIDER_SITE_OTHER): Payer: 59 | Admitting: Diagnostic Neuroimaging

## 2014-11-10 ENCOUNTER — Encounter: Payer: Self-pay | Admitting: Diagnostic Neuroimaging

## 2014-11-10 VITALS — BP 112/73 | HR 78 | Ht 66.0 in | Wt 195.0 lb

## 2014-11-10 DIAGNOSIS — M3509 Sicca syndrome with other organ involvement: Secondary | ICD-10-CM | POA: Diagnosis not present

## 2014-11-10 DIAGNOSIS — M3500 Sicca syndrome, unspecified: Secondary | ICD-10-CM

## 2014-11-10 DIAGNOSIS — R200 Anesthesia of skin: Secondary | ICD-10-CM

## 2014-11-10 DIAGNOSIS — R5383 Other fatigue: Secondary | ICD-10-CM

## 2014-11-10 DIAGNOSIS — G63 Polyneuropathy in diseases classified elsewhere: Secondary | ICD-10-CM

## 2014-11-10 DIAGNOSIS — R413 Other amnesia: Secondary | ICD-10-CM | POA: Diagnosis not present

## 2014-11-10 DIAGNOSIS — M3506 Sjogren syndrome with peripheral nervous system involvement: Secondary | ICD-10-CM

## 2014-11-10 DIAGNOSIS — R0683 Snoring: Secondary | ICD-10-CM

## 2014-11-10 NOTE — Patient Instructions (Signed)
Thank you for coming to see [place] at [facility]. I hope we have been able to provide you high quality care today.  You may receive a patient satisfaction survey over the next few weeks. We would appreciate your feedback and comments so that we may continue to improve ourselves and the health of our patients.  - try compounded neuropathy cream - setup sleep study with Dr. Elsworth Soho    ~~~~~~~~~~~~~~~~~~~~~~~~~~~~~~~~~~~~~~~~~~~~~~~~~~~~~~~~~~~~~~~~~  DR. PENUMALLI'S GUIDE TO HAPPY AND HEALTHY LIVING These are some of my general health and wellness recommendations. Some of them may apply to you better than others. Please use common sense as you try these suggestions and feel free to ask me any questions.   ACTIVITY/FITNESS Mental, social, emotional and physical stimulation are very important for brain and body health. Try learning a new activity (arts, music, language, sports, games).  Keep moving your body to the best of your abilities. You can do this at home, inside or outside, the park, community center, gym or anywhere you like. Consider a physical therapist or personal trainer to get started. Consider the app Sworkit. Fitness trackers such as smart-watches, smart-phones or Fitbits can help as well.   NUTRITION Eat more plants: colorful vegetables, nuts, seeds and berries.  Eat less sugar, salt, preservatives and processed foods.  Avoid toxins such as cigarettes and alcohol.  Drink water when you are thirsty. Warm water with a slice of lemon is an excellent morning drink to start the day.  Consider these websites for more information The Nutrition Source (https://www.henry-hernandez.biz/) Precision Nutrition (WindowBlog.ch)   RELAXATION Consider practicing mindfulness meditation or other relaxation techniques such as deep breathing, prayer, yoga, tai chi, massage. See website mindful.org or the apps Headspace or Calm to  help get started.   SLEEP Try to get at least 7-8+ hours sleep per day. Regular exercise and reduced caffeine will help you sleep better. Practice good sleep hygeine techniques. See website sleep.org for more information.   PLANNING Prepare estate planning, living will, healthcare POA documents. Sometimes this is best planned with the help of an attorney. Theconversationproject.org and agingwithdignity.org are excellent resources.

## 2014-11-10 NOTE — Progress Notes (Signed)
[facility]  PATIENT: Allison Davies DOB: 1970-02-14  REFERRING CLINICIAN: S Deveshwar  HISTORY FROM: patient   REASON FOR VISIT: new consult    HISTORICAL  CHIEF COMPLAINT:  Chief Complaint  Patient presents with  . Numbness    rm 7, New Patient  . Memory Loss    MMSE 28    HISTORY OF PRESENT ILLNESS:   44 year old left-handed female with history of systemic lupus, Sjogren syndrome, prediabetes, here for evaluation of numbness and tingling, memory loss and headaches.  Patient was diagnosed with autoimmune disease approximate 4 years ago. Patient is currently on Plaquenil and Imuran. Over past few months Sjogren's symptoms have flared up. Imuran was started last month.  Patient has had numbness and tingling in the fingers, hands, feet for approximately 10 years. Symptoms are mild over many years but in the last 3 weeks these have significantly worsened. She wakes up in the morning sometimes with severe pain, numbness tingling, falling asleep sensation which last 10 minutes or so and then eases up. Patient has noted generalized joint pain and swelling. Her parotid glands are more tender.  Over past 2 months patient has had more problems with memory, short-term problems, concentration and focus and attention. She is having to write notes and take more effort to remember previously easy to remember tasks.  Patient also having more headaches intermittently over the past few months.  Has had long history of snoring and interrupted sleep. She has significant daytime fatigue. She's never had a sleep study.    REVIEW OF SYSTEMS: Full 14 system review of systems performed and notable only for fatigue swelling in legs ringing in ears snoring feeling hot increased thirst flushing joint pain joint swelling cramps aching muscles decreased energy difficulty swallowing numbness weakness headache memory loss sleepiness snoring.  ALLERGIES: Allergies  Allergen Reactions    . Lemon Extract [Flavoring Agent] Anaphylaxis  . Prochlorperazine Edisylate Anaphylaxis    Compazine  . Codeine Nausea And Vomiting    severe  . Methotrexate Derivatives Nausea Only    Dizzy  . Promethazine Swelling  . Tramadol Other (See Comments)    Severe headache  . Ativan [Lorazepam] Palpitations  . Hydrocodone Palpitations    Patient states that she can not take narcotics. Tylenol is the only pain medication she can take.  . Isometheptene-Dichloral-Apap Palpitations    Darvocet  . Klonopin [Clonazepam] Palpitations  . Moxifloxacin Rash  . Penicillins Hives and Rash  . Plaquenil [Hydroxychloroquine] Palpitations  . Xanax [Alprazolam] Palpitations    HOME MEDICATIONS: Outpatient Prescriptions Prior to Visit  Medication Sig Dispense Refill  . acetaminophen (TYLENOL) 500 MG tablet Take 500 mg by mouth every 6 (six) hours as needed for moderate pain.     . cholecalciferol (VITAMIN D) 1000 UNITS tablet Take 1,000 Units by mouth daily.    Marland Kitchen FLUoxetine (PROZAC) 20 MG capsule Take 1 capsule (20 mg total) by mouth daily. 90 capsule 1  . fluticasone (FLONASE) 50 MCG/ACT nasal spray Place 1 spray into both nostrils daily.    . folic acid (FOLVITE) 1 MG tablet Take 1 mg by mouth daily.    . hydroxychloroquine (PLAQUENIL) 200 MG tablet Take 200 mg by mouth daily.    . Multiple Vitamin (MULTIVITAMIN) capsule Take 1 capsule by mouth daily.    Marland Kitchen OVER THE COUNTER MEDICATION Apply 1 application topically as needed (for joint pain). 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  OVER THE COUNTER MEDICATION Apply 1 application topically daily. Neutrogena  Ultra Sheer sunblock , uses everyday due to her Lupus    . pantoprazole (PROTONIX) 40 MG tablet Take 1 tablet (40 mg total) by mouth 2 (two) times daily. 90 tablet 0  . tiZANidine (ZANAFLEX) 2 MG tablet Take 2 mg by mouth every evening.    . fluorometholone (FML) 0.1 % ophthalmic suspension 1 drop every 4 (four)  hours.    Marland Kitchen ibuprofen (ADVIL,MOTRIN) 200 MG tablet Take 400 mg by mouth every morning.    . meloxicam (MOBIC) 15 MG tablet Take 15 mg by mouth at bedtime.     . Methotrexate, PF, 7.5 MG/0.4ML SOAJ Inject into the skin.    . Pediatric Multiple Vit-C-FA (FLINSTONES GUMMIES OMEGA-3 DHA) CHEW Chew by mouth. Pt. States it the Complete Gummies    . triamcinolone (KENALOG) 0.1 % paste Use as directed 1 application in the mouth or throat 2 (two) times daily. (Patient not taking: Reported on 06/29/2014) 5 g 12   No facility-administered medications prior to visit.    PAST MEDICAL HISTORY: Past Medical History  Diagnosis Date  . GERD (gastroesophageal reflux disease)   . Lupus   . Morphea   . Joint pain   . Connective tissue disease     Lupus  . Fatty liver   . Blood glucose abnormal   . Muscle spasm of back   . Hiatal hernia   . Asthma     rarely uses inhaler  . Anxiety     no meds, history of  . Depression      no meds, history of  . Sjogren's syndrome   . Plantar fascia syndrome   . Ocular hypertension     PAST SURGICAL HISTORY: Past Surgical History  Procedure Laterality Date  . Appendectomy  1994  . Salpingectomy      bi-lateral  . Wisdom tooth extraction    . Hysteroscopy w/d&c N/A 10/16/2012    Procedure: DILATATION AND CURETTAGE /HYSTEROSCOPY;  Surgeon: Marylynn Pearson, MD;  Location: [facility];  Service: Gynecology;  Laterality: N/A;  . Laparoscopic assisted vaginal hysterectomy N/A 04/28/2013    Procedure: LAPAROSCOPIC ASSISTED VAGINAL HYSTERECTOMY;  Surgeon: Thurnell Lose, MD;  Location: [facility];  Service: Gynecology;  Laterality: N/A;  . Vulva /perineum biopsy N/A 04/28/2013    Procedure: VULVAR BIOPSY;  Surgeon: Thurnell Lose, MD;  Location: [facility];  Service: Gynecology;  Laterality: N/A;    FAMILY HISTORY: Family History  Problem Relation Age of Onset  . Colon cancer Neg Hx   . Suicidality Neg Hx   . Diabetes Mother   . Heart disease Mother   . Depression Mother     . Diabetes Father   . Depression Father   . Diabetes Maternal Grandfather   . Heart disease Maternal Grandfather   . Diabetes Maternal Grandmother   . Heart disease Maternal Grandmother   . Heart disease Brother   . Hypertension Brother   . Stroke Brother     SOCIAL HISTORY:  Social History   Social History  . Marital Status: Married    Spouse Name: N/A  . Number of Children: 3  . Years of Education: 14   Occupational History  . [employer]   Social History Main Topics  . Smoking status: Former Smoker -- 2.00 packs/day for 20 years    Types: Cigarettes    Quit date: 03/01/2003  . Smokeless tobacco: Never Used  .  Alcohol Use: No  . Drug Use: No  . Sexual Activity: Yes    Birth Control/ Protection: Surgical   Other Topics Concern  . Not on file   Social History Narrative   Lives at home with spouse   Caffeine use- 1 diet Dr Malachi Bonds daily, 8 oz     PHYSICAL EXAM  GENERAL EXAM/CONSTITUTIONAL: Vitals:  Filed Vitals:   11/10/14 1559  BP: 112/73  Pulse: 78  Height: 5\' 6"  (1.676 m)  Weight: 195 lb (88.451 kg)     Body mass index is 31.49 kg/(m^2).  Visual Acuity Screening   Right eye Left eye Both eyes  Without correction: 20/30 20/30   With correction:        Patient is in no distress; well developed, nourished and groomed; neck is supple  BILATERAL PAROTID SWELLING  CARDIOVASCULAR:  Examination of carotid arteries is normal; no carotid bruits  Regular rate and rhythm, no murmurs  Examination of peripheral vascular system by observation and palpation is normal  EYES:  Ophthalmoscopic exam of optic discs and posterior segments is normal; no papilledema or hemorrhages  MUSCULOSKELETAL:  Gait, strength, tone, movements noted in Neurologic exam below  NEUROLOGIC: MENTAL STATUS:  MMSE - Mini Mental State Exam 11/10/2014  Orientation to time 5  Orientation to Place 5  Registration 3  Attention/ Calculation 4  Recall 2   Language- name 2 objects 2  Language- repeat 1  Language- follow 3 step command 3  Language- read & follow direction 1  Write a sentence 1  Copy design 1  Total score 28    awake, alert, oriented to person, place and time  recent and remote memory intact  normal attention and concentration  language fluent, comprehension intact, naming intact,   fund of knowledge appropriate  CRANIAL NERVE:   2nd - no papilledema on fundoscopic exam  2nd, 3rd, 4th, 6th - pupils equal and reactive to light, visual fields full to confrontation, extraocular muscles intact, no nystagmus  5th - facial sensation symmetric  7th - facial strength symmetric  8th - hearing intact  9th - palate elevates symmetrically, uvula midline  11th - shoulder shrug symmetric  12th - tongue protrusion midline  MOTOR:   normal bulk and tone, full strength in the BUE, BLE  SENSORY:   normal and symmetric to light touch, pinprick, temperature, vibration; EXCEPT FOR DECR PP IN RIGHT HAND FINGERTIPS  COORDINATION:   finger-nose-finger, fine finger movements normal  REFLEXES:   deep tendon reflexes present and symmetric; BRISK AT KNEES  GAIT/STATION:   narrow based gait; able to walk tandem; romberg is negative    DIAGNOSTIC DATA (LABS, IMAGING, TESTING) - I reviewed patient records, labs, notes, testing and imaging myself where available.  Lab Results  Component Value Date   WBC 16.8* 04/29/2013   HGB 11.7* 04/29/2013   HCT 34.5* 04/29/2013   MCV 95.0 04/29/2013   PLT 198 04/29/2013      Component Value Date/Time   NA 140 04/29/2013 0515   K 4.2 04/29/2013 0515   CL 107 04/29/2013 0515   CO2 21 04/29/2013 0515   GLUCOSE 123* 04/29/2013 0515   BUN 10 04/29/2013 0515   CREATININE 0.68 04/29/2013 0515   CALCIUM 8.1* 04/29/2013 0515   PROT 7.3 12/10/2011 0350   ALBUMIN 3.8 12/10/2011 0350   AST 26 12/10/2011 0350   ALT 28 12/10/2011 0350   ALKPHOS 115 12/10/2011 0350   BILITOT  0.3 12/10/2011 0350   GFRNONAA >90  04/29/2013 0515   GFRAA >90 04/29/2013 0515   No results found for: CHOL, HDL, LDLCALC, LDLDIRECT, TRIG, CHOLHDL No results found for: HGBA1C Lab Results  Component Value Date   VITAMINB12 267 10/04/2009   Lab Results  Component Value Date   TSH 4.06 10/04/2009    10/30/07 CT head [I reviewed images myself and agree with interpretation. -VRP]  - normal    ASSESSMENT AND PLAN  44 y.o. year old female here with autoimmune disease, lupus, Sjogren's syndrome, with long-standing numbness and tingling in the fingers and feet. Most likely represents neuropathy related to Sjogren's disease. Symptoms have increased and correlation with flareup of her other Sjogren's symptoms.  Also with memory and focus problems, decreased concentration, likely related to underlying pain, poor sleep, autoimmune disease as well.  Also with headaches which may be cervicogenic, migraine or tension headaches.   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 Elsworth Soho - follow up Dr. Estanislado Pandy re: auto-immune disease and disease modifying therapy; may need temporary escalation with steroids for current flare of symptoms, until imuran takes effect  Return in about 6 weeks (around 12/22/2014).    Penni Bombard, MD 04/21/4074, 8:08 PM Certified in Neurology, Neurophysiology and Neuroimaging  [facility] [address], [place], [place] [address] [phone]

## 2014-11-11 ENCOUNTER — Encounter: Payer: Self-pay | Admitting: Diagnostic Neuroimaging

## 2014-11-11 DIAGNOSIS — G4733 Obstructive sleep apnea (adult) (pediatric): Secondary | ICD-10-CM

## 2014-11-11 NOTE — Addendum Note (Signed)
Addended byAndrey Spearman on: 11/11/2014 11:43 AM   Modules accepted: Orders

## 2014-11-11 NOTE — Addendum Note (Signed)
Addended by: Minna Antis on: 11/11/2014 08:46 AM   Modules accepted: Medications

## 2014-11-12 DIAGNOSIS — G4733 Obstructive sleep apnea (adult) (pediatric): Secondary | ICD-10-CM | POA: Diagnosis not present

## 2014-11-15 ENCOUNTER — Other Ambulatory Visit: Payer: Self-pay | Admitting: *Deleted

## 2014-11-15 ENCOUNTER — Encounter: Payer: Self-pay | Admitting: Diagnostic Neuroimaging

## 2014-11-15 ENCOUNTER — Telehealth: Payer: Self-pay | Admitting: Diagnostic Neuroimaging

## 2014-11-15 DIAGNOSIS — R5383 Other fatigue: Secondary | ICD-10-CM

## 2014-11-15 DIAGNOSIS — R0683 Snoring: Secondary | ICD-10-CM

## 2014-11-15 NOTE — Telephone Encounter (Signed)
Pt called and would like to know about her sleep study results that were sent to the nurse this past Friday, 11/12/14. Pt also wanted to know if Dr. Leta Baptist has been in touch with Dr. Patrecia Pour.  Please call and advise [phone] may leave voice message.

## 2014-11-15 NOTE — Telephone Encounter (Signed)
I called patient; no answer. Left message. -VRP

## 2014-11-15 NOTE — Telephone Encounter (Signed)
Called again, no answer. -VRP

## 2014-11-16 ENCOUNTER — Telehealth: Payer: Self-pay | Admitting: Diagnostic Neuroimaging

## 2014-11-16 MED ORDER — GABAPENTIN 300 MG PO CAPS: 300.0000 mg | ORAL_CAPSULE | Freq: Two times a day (BID) | ORAL | Status: DC

## 2014-11-16 NOTE — Telephone Encounter (Signed)
Try gabapentin 300mg  at bedtime x 1 week; then gradually increase to twice a day. -VRP

## 2014-11-16 NOTE — Telephone Encounter (Addendum)
Left vm requesting call back re: sleep apnea referral.  1:14 pm Received call back from patient who states she "will take care of sleep apnea. " She states she does not need a referral. She also states she cannot afford cream Dr Leta Baptist ordered and requests he order Gabapentin, to [facility].  She states they discussed that in her OV.  Informed her would route her request to Dr Leta Baptist. She verbalized understanding, appreciation.

## 2014-11-16 NOTE — Telephone Encounter (Signed)
Spoke with patient and per Dr Leta Baptist, informed her of sleep study results: moderate sleep apnea and treatment options. Informed her that Dr Leta Baptist refers patient to sleep dr for treatment. 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 Informed her would discuss with Dr Leta Baptist for his reply and call her back. Also discussed her c/o pain, and advised she call Dr Estanislado Pandy who treats her for Lupus. She states she plans to call her today. She verbalized understanding, appreciation for call.

## 2014-11-16 NOTE — Telephone Encounter (Signed)
Left vm for patient informing her Gabapentin script sent to her pharmacy, gave her Dr Gladstone Lighter specific instructions and repeated them. Advised she call back as needed. Left this caller's name, number.

## 2014-11-16 NOTE — Telephone Encounter (Signed)
Patient returned call. Please call [phone] ask to speak to her.

## 2014-11-16 NOTE — Telephone Encounter (Signed)
Patient called to advise she has been having a lot of pain for past 2 days, last night was really bad, excruciating, this morning achy from head to tiptoes. Call [phone] ask for her.

## 2014-11-19 ENCOUNTER — Ambulatory Visit (INDEPENDENT_AMBULATORY_CARE_PROVIDER_SITE_OTHER): Payer: 59 | Admitting: Internal Medicine

## 2014-11-19 ENCOUNTER — Encounter: Payer: Self-pay | Admitting: Internal Medicine

## 2014-11-19 VITALS — BP 116/72 | HR 76 | Ht 66.0 in | Wt 193.0 lb

## 2014-11-19 DIAGNOSIS — G4733 Obstructive sleep apnea (adult) (pediatric): Secondary | ICD-10-CM

## 2014-11-19 DIAGNOSIS — Z8719 Personal history of other diseases of the digestive system: Secondary | ICD-10-CM

## 2014-11-19 DIAGNOSIS — M329 Systemic lupus erythematosus, unspecified: Secondary | ICD-10-CM | POA: Insufficient documentation

## 2014-11-19 MED ORDER — TRIAMCINOLONE ACETONIDE 0.1 % MT PSTE: 1.0000 "application " | PASTE | Freq: Two times a day (BID) | OROMUCOSAL | Status: DC

## 2014-11-19 NOTE — Patient Instructions (Signed)
Order- new DME, new CPAP auto 5-15, mask of choice, humidifier, supplies, AirView   Dx OSA  Script sent refilling orabase dental ointment

## 2014-11-19 NOTE — Progress Notes (Signed)
11/19/14- 65 yoF former smoker referred courtesy of Dr. Leta Baptist: had HST 11-11-14. pt. Snores, witnessed apneas.  Difficulty maintaining sleep, frequently waking during the night. This is partly because of joint and muscle pains related to history of lupus. Bedtime around 9 or 10 PM estimating 20 minutes sleep latency before up at 6:30 AM. Remote history of asthma with no recent wheezing or active heart or respiratory problems. Significant medical issue with systemic lupus/Sjogren's/morphea-scleroderma for which she is currently on a prednisone taper. No ENT surgery. Unattended Home Sleep Test 11/11/2014-moderate obstructive sleep apnea, AHI 17.8 per hour with desaturation to a nadir of 82% on room air. Body weight was 195 pounds. Epworth score 5/24  Prior to Admission medications   Medication Sig Start Date End Date Taking? Authorizing Provider  acetaminophen (TYLENOL) 500 MG tablet Take 500 mg by mouth every 6 (six) hours as needed for moderate pain.    Yes Historical Provider, MD  azaTHIOprine (IMURAN) 50 MG tablet 50 mg daily. 10/05/14  Yes Historical Provider, MD  cholecalciferol (VITAMIN D) 1000 UNITS tablet Take 1,000 Units by mouth daily.   Yes Historical Provider, MD  FLUoxetine (PROZAC) 20 MG capsule Take 1 capsule (20 mg total) by mouth daily. 06/29/14  Yes Charlcie Cradle, MD  fluticasone (FLONASE) 50 MCG/ACT nasal spray Place 1 spray into both nostrils daily.   Yes Historical Provider, MD  folic acid (FOLVITE) 1 MG tablet Take 1 mg by mouth daily.   Yes Historical Provider, MD  gabapentin (NEURONTIN) 300 MG capsule Take 1 capsule (300 mg total) by mouth 2 (two) times daily. 11/16/14  Yes Penni Bombard, MD  hydroxychloroquine (PLAQUENIL) 200 MG tablet Take 200 mg by mouth daily.   Yes Historical Provider, MD  Multiple Vitamin (MULTIVITAMIN) capsule Take 1 capsule by mouth daily.   Yes Historical Provider, MD  NON FORMULARY Fiber Gummy   Yes Historical Provider, MD  NONFORMULARY OR  COMPOUNDED ITEM Baclofen 3%, Clonidine 0.2%, Diclofenac 4%, Gabapentin 6%, Ketamine 15%, Lidocaine 3% Musculoskeletal pain or inflammation, [place] 11/11/14  Yes Historical Provider, MD  OVER THE COUNTER MEDICATION Apply 1 application topically as needed (for joint pain). 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 Neutrogena  Ultra Sheer sunblock , uses everyday due to her Lupus   Yes Historical Provider, MD  pantoprazole (PROTONIX) 40 MG tablet Take 1 tablet (40 mg total) by mouth 2 (two) times daily. 01/26/13  Yes Sable Feil, MD  predniSONE (DELTASONE) 10 MG tablet Take 10 mg by mouth daily with breakfast. On tapper   Yes Historical Provider, MD  tiZANidine (ZANAFLEX) 2 MG tablet Take 2 mg by mouth every evening.   Yes Historical Provider, MD  triamcinolone (KENALOG) 0.1 % paste Use as directed 1 application in the mouth or throat 2 (two) times daily. 11/19/14   Deneise Lever, MD   Past Medical History  Diagnosis Date  . GERD (gastroesophageal reflux disease)   . Lupus ([place])   . Morphea   . Joint pain   . Connective tissue disease ([place])     Lupus  . Fatty liver   . Blood glucose abnormal   . Muscle spasm of back   . Hiatal hernia   . Asthma     rarely uses inhaler  . Anxiety     no meds, history of  . Depression  no meds, history of  . Sjogren's syndrome ([place])   . Plantar fascia syndrome   . Ocular hypertension    Past Surgical History  Procedure Laterality Date  . Appendectomy  1994  . Salpingectomy      bi-lateral  . Wisdom tooth extraction    . Hysteroscopy w/d&c N/A 10/16/2012    Procedure: DILATATION AND CURETTAGE /HYSTEROSCOPY;  Surgeon: Marylynn Pearson, MD;  Location: [facility];  Service: Gynecology;  Laterality: N/A;  . Laparoscopic assisted vaginal hysterectomy N/A 04/28/2013    Procedure: LAPAROSCOPIC ASSISTED VAGINAL HYSTERECTOMY;   Surgeon: Thurnell Lose, MD;  Location: [facility];  Service: Gynecology;  Laterality: N/A;  . Vulva /perineum biopsy N/A 04/28/2013    Procedure: VULVAR BIOPSY;  Surgeon: Thurnell Lose, MD;  Location: [facility];  Service: Gynecology;  Laterality: N/A;   Family History  Problem Relation Age of Onset  . Colon cancer Neg Hx   . Suicidality Neg Hx   . Diabetes Mother   . Heart disease Mother   . Depression Mother   . Diabetes Father   . Depression Father   . Diabetes Maternal Grandfather   . Heart disease Maternal Grandfather   . Diabetes Maternal Grandmother   . Heart disease Maternal Grandmother   . Heart disease Brother   . Hypertension Brother   . Stroke Brother    Social History   Social History  . Marital Status: Married    Spouse Name: N/A  . Number of Children: 3  . Years of Education: 14   Occupational History  . [employer]   Social History Main Topics  . Smoking status: Former Smoker -- 2.00 packs/day for 20 years    Types: Cigarettes    Quit date: 03/01/2003  . Smokeless tobacco: Never Used  . Alcohol Use: No  . Drug Use: No  . Sexual Activity: Yes    Birth Control/ Protection: Surgical   Other Topics Concern  . Not on file   Social History Narrative   Lives at home with spouse   Caffeine use- 1 diet Dr Malachi Bonds daily, 8 oz   ROS-see HPI   Negative unless "+" Constitutional:    weight loss, night sweats, fevers, chills, + fatigue, lassitude. HEENT:    headaches, difficulty swallowing, tooth/dental problems, sore throat,       sneezing, itching, ear ache, nasal congestion, post nasal drip, snoring CV:    chest pain, orthopnea, PND, swelling in lower extremities, anasarca,                                                     dizziness, palpitations Resp:   shortness of breath with exertion or at rest.                productive cough,   non-productive cough, coughing up of blood.              change in color of mucus.  wheezing.   Skin:    rash  or lesions. GI:  No-   heartburn, indigestion, abdominal pain, nausea, vomiting, diarrhea,                 change in bowel habits, loss of appetite GU: dysuria, change in color of urine, no urgency or frequency.   flank pain. MS:  joint pain, stiffness, decreased range of motion, back pain. Neuro-     nothing unusual Psych:  change in mood or affect.  depression or anxiety.   memory loss.  OBJ- Physical Exam General- Alert, Oriented, Affect-appropriate, Distress- none acute Skin- rash-none, lesions- none, excoriation- none Lymphadenopathy- none Head- atraumatic            Eyes- Gross vision intact, PERRLA, conjunctivae and secretions clear            Ears- Hearing, canals-normal            Nose- Clear, no-Septal dev, mucus, polyps, erosion, perforation             Throat- Mallampati IV , mucosa clear , drainage- none, tonsils- atrophic Neck- flexible , trachea midline, no stridor , thyroid nl, carotid no bruit, + prominent parotid glands bilaterally Chest - symmetrical excursion , unlabored           Heart/CV- RRR , no murmur , no gallop  , no rub, nl s1 s2                           - JVD- none , edema- none, stasis changes- none, varices- none           Lung- clear to P&A, wheeze- none, cough- none , dullness-none, rub- none           Chest wall-  Abd-  Br/ Gen/ Rectal- Not done, not indicated Extrem- cyanosis- none, clubbing, none, atrophy- none, strength- nl Neuro- grossly intact to observation

## 2014-11-19 NOTE — Assessment & Plan Note (Signed)
Dr. Estanislado Pandy Complicated by Sjogren's and scleroderma/morphea Currently on prednisone taper

## 2014-11-19 NOTE — Assessment & Plan Note (Signed)
She associates recurrent oral mucosal ulcers with her Lupus Plan-Kenalog with Orabase

## 2014-11-19 NOTE — Assessment & Plan Note (Signed)
We reviewed her sleep study results, discussed medical implications and treatment options. Plan-begin CPAP, starting with auto titration

## 2014-12-14 ENCOUNTER — Encounter: Payer: Self-pay | Admitting: Diagnostic Neuroimaging

## 2014-12-28 ENCOUNTER — Ambulatory Visit (INDEPENDENT_AMBULATORY_CARE_PROVIDER_SITE_OTHER): Payer: 59 | Admitting: Diagnostic Neuroimaging

## 2014-12-28 ENCOUNTER — Encounter: Payer: Self-pay | Admitting: Diagnostic Neuroimaging

## 2014-12-28 ENCOUNTER — Ambulatory Visit (INDEPENDENT_AMBULATORY_CARE_PROVIDER_SITE_OTHER): Payer: Self-pay | Admitting: Psychiatry

## 2014-12-28 ENCOUNTER — Encounter (HOSPITAL_COMMUNITY): Payer: Self-pay | Admitting: Psychiatry

## 2014-12-28 VITALS — BP 108/69 | HR 83 | Ht 66.0 in | Wt 197.8 lb

## 2014-12-28 VITALS — BP 113/73 | HR 94 | Ht 66.0 in | Wt 197.2 lb

## 2014-12-28 DIAGNOSIS — R413 Other amnesia: Secondary | ICD-10-CM | POA: Diagnosis not present

## 2014-12-28 DIAGNOSIS — M7072 Other bursitis of hip, left hip: Secondary | ICD-10-CM

## 2014-12-28 DIAGNOSIS — M3509 Sicca syndrome with other organ involvement: Secondary | ICD-10-CM

## 2014-12-28 DIAGNOSIS — M3506 Sjogren syndrome with peripheral nervous system involvement: Secondary | ICD-10-CM

## 2014-12-28 DIAGNOSIS — M3500 Sicca syndrome, unspecified: Secondary | ICD-10-CM

## 2014-12-28 DIAGNOSIS — R200 Anesthesia of skin: Secondary | ICD-10-CM

## 2014-12-28 DIAGNOSIS — M7071 Other bursitis of hip, right hip: Secondary | ICD-10-CM

## 2014-12-28 DIAGNOSIS — G63 Polyneuropathy in diseases classified elsewhere: Secondary | ICD-10-CM

## 2014-12-28 DIAGNOSIS — M797 Fibromyalgia: Secondary | ICD-10-CM

## 2014-12-28 DIAGNOSIS — F4322 Adjustment disorder with anxiety: Secondary | ICD-10-CM

## 2014-12-28 HISTORY — DX: Other bursitis of hip, left hip: M70.72

## 2014-12-28 HISTORY — DX: Other bursitis of hip, left hip: M70.71

## 2014-12-28 HISTORY — DX: Fibromyalgia: M79.7

## 2014-12-28 MED ORDER — FLUOXETINE HCL 20 MG PO CAPS: 20.0000 mg | ORAL_CAPSULE | Freq: Every day | ORAL | Status: DC

## 2014-12-28 NOTE — Progress Notes (Signed)
Short Progress Note  Allison Davies [id number] 44 y.o.  12/28/2014 4:41 PM  Chief Complaint: doing ok  History of Present Illness: Pt has been doing well except for continued health issues. Pt has been diagnosed with fibromyalgia. Pt has a new granddaughter. Pt has no complaints today.   Anxiety has resolved. Denies racing thoughts, insomnia, GI upset, muscle tension. Denies panic attacks.  Mood is good and denies depression.. Denies isolation, anhedonia, crying spells, worthlessness and hopelessness.  Sleep is ok and she is getting about 9 hrs/night with her CPAP. Energy, appetite and concentration are all good.   Taking Prozac as prescribed and denies SE.   Suicidal Ideation: No Plan Formed: No Patient has means to carry out plan: No  Homicidal Ideation: No Plan Formed: No Patient has means to carry out plan: No  Review of Systems: Psychiatric: Agitation: No Hallucination: No Depressed Mood: No Insomnia: No Hypersomnia: No Altered Concentration: No Feels Worthless: No Grandiose Ideas: No Belief In Special Powers: No New/Increased Substance Abuse: No Compulsions: No  Neurologic: Headache: No Seizure: No Paresthesias: No   Review of Systems  Constitutional: Negative for fever, chills and weight loss.  HENT: Negative for congestion, ear pain and sore throat.   Eyes: Positive for blurred vision and pain. Negative for double vision.  Respiratory: Negative for cough, sputum production and shortness of breath.   Cardiovascular: Negative for chest pain, palpitations and leg swelling.  Gastrointestinal: Negative for heartburn, nausea, vomiting and abdominal pain.  Musculoskeletal: Positive for back pain and joint pain. Negative for neck pain.  Skin: Positive for rash. Negative for itching.  Neurological: Negative for dizziness, sensory change, seizures, loss of consciousness and headaches.  Psychiatric/Behavioral: Negative for depression,  suicidal ideas, hallucinations and substance abuse. The patient is not nervous/anxious and does not have insomnia.      Past Medical Family, Social History: [place], with husband and cats. Has 5 kids. 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  reports that she quit smoking about 11 years ago. Her smoking use included Cigarettes. She has a 40 pack-year smoking history. She has never used smokeless tobacco. She reports that she does not drink alcohol or use illicit drugs.  Family History  Problem Relation Age of Onset  . Colon cancer Neg Hx   . Suicidality Neg Hx   . Diabetes Mother   . Heart disease Mother   . Depression Mother   . Diabetes Father   . Depression Father   . Diabetes Maternal Grandfather   . Heart disease Maternal Grandfather   . Diabetes Maternal Grandmother   . Heart disease Maternal Grandmother   . Heart disease Brother   . Hypertension Brother   . Stroke Brother     Past Medical History  Diagnosis Date  . GERD (gastroesophageal reflux disease)   . Lupus ([place])   . Morphea   . Joint pain   . Connective tissue disease ([place])     Lupus  . Fatty liver   . Blood glucose abnormal   . Muscle spasm of back   . Hiatal hernia   . Asthma     rarely uses inhaler  . Anxiety     no meds, history of  . Depression      no meds, history of  . Sjogren's syndrome ([place])   . Plantar fascia syndrome   . Ocular hypertension   . Fibromyalgia 12/28/14  . Bursitis of both hips 12/28/14    Outpatient Encounter  Prescriptions as of 12/28/2014  Medication Sig  . acetaminophen (TYLENOL) 500 MG tablet Take 500 mg by mouth every 6 (six) hours as needed for moderate pain.   Marland Kitchen azaTHIOprine (IMURAN) 50 MG tablet 50 mg 2 (two) times daily.   Marland Kitchen FLUoxetine (PROZAC) 20 MG capsule Take 1 capsule (20 mg total) by mouth daily.  . fluticasone (FLONASE) 50 MCG/ACT nasal spray Place 1 spray into both nostrils daily.  . folic acid (FOLVITE) 1 MG tablet Take 1 mg by mouth daily.  .  hydroxychloroquine (PLAQUENIL) 200 MG tablet Take 200 mg by mouth daily.  . Multiple Vitamin (MULTIVITAMIN) capsule Take 1 capsule by mouth daily.  . NON FORMULARY Fiber Gummy  . OVER THE COUNTER MEDICATION Apply 1 application topically daily. Neutrogena  Ultra Sheer sunblock , uses everyday due to her Lupus  . pantoprazole (PROTONIX) 40 MG tablet Take 1 tablet (40 mg total) by mouth 2 (two) times daily.  . NONFORMULARY OR COMPOUNDED ITEM Baclofen 3%, Clonidine 0.2%, Diclofenac 4%, Gabapentin 6%, Ketamine 15%, Lidocaine 3% Musculoskeletal pain or inflammation, [facility]  . triamcinolone (KENALOG) 0.1 % paste Use as directed 1 application in the mouth or throat 2 (two) times daily. (Patient not taking: Reported on 12/28/2014)   No facility-administered encounter medications on file as of 12/28/2014.    Past Psychiatric History/Hospitalization(s): Anxiety: Yes Bipolar Disorder: No Depression: Yes Mania: No Psychosis: No Schizophrenia: No Personality Disorder: No Hospitalization for psychiatric illness: No History of Electroconvulsive Shock Therapy: No Prior Suicide Attempts: No  Physical Exam: Constitutional:  BP 113/73 mmHg  Pulse 94  Ht 5\' 6"  (1.676 m)  Wt 197 lb 3.2 oz (89.449 kg)  BMI 31.84 kg/m2  LMP 01/20/2013  General Appearance: alert, oriented, no acute distress and well nourished  Musculoskeletal: Strength & Muscle Tone: within normal limits Gait & Station: normal Patient leans: N/A  Mental Status Examination/Evaluation: Objective: Attitude: Calm and cooperative  Appearance: Fairly Groomed, appears to be stated age  Eye Contact::  Good  Speech:  Clear and Coherent and Normal Rate  Volume:  Normal  Mood:  euthymic  Affect:  Full Range  Thought Process:  Goal Directed, Linear and Logical  Orientation:  Full (Time, Place, and Person)  Thought Content:  Negative  Suicidal Thoughts:  No  Homicidal Thoughts:  No  Judgement:  Good  Insight:  Good   Concentration: good  Memory: Immediate-good Recent-good Remote-good  Recall: fair  Language: fair  Gait and Station: normal  [employer] of Knowledge: average  Psychomotor Activity:  Normal  Akathisia:  No  Handed:  Right  AIMS (if indicated):  n/a  Assets:  Armed forces logistics/support/administrative officer Desire for Improvement Financial Resources/Insurance Housing Intimacy Leisure Time Social Support       Medical Decision Making (Choose Three): Established Problem, Stable/Improving (1), Review of Psycho-Social Stressors (1) and Review of Medication Regimen & Side Effects (2)  Assessment: Anxiety seems to be related to her health stressors. Per her reports anxiety spikes with med changes and stressors with her health.   AXIS I Adjustment disorder with anxious mood vs anxiety due to medical illness  AXIS II Deferred  AXIS III Past Medical History  Diagnosis Date  . GERD (gastroesophageal reflux disease)   . Lupus   . Morphea   . Joint pain   . Connective tissue disease     Lupus  . Fatty liver   . Blood glucose abnormal   . Muscle spasm of back   . Hiatal hernia   .  Asthma     rarely uses inhaler  . Anxiety     no meds, history of  . Depression     no meds, history of  . Sjogren's syndrome      AXIS IV health stressors  AXIS V 51-60 moderate symptoms   Treatment Plan/Recommendations:  Plan of Care: Medication management with supportive therapy. Risks/benefits and SE of the medication discussed. Pt verbalized understanding and verbal consent obtained for treatment. Affirm with the patient that the medications are taken as ordered. Patient expressed understanding of how their medications were to be used.     Laboratory: None today  Psychotherapy: Therapy: brief supportive therapy provided. Discussed psychosocial stressors in detail.    Medications: continue Prozac 20mg  po qD for anxiety  Routine PRN  Medications: No  Consultations: none at this time  Safety Concerns: Pt denies SI and is at an acute low risk for suicide.Patient told to call clinic if any problems occur. Patient advised to go to ER if they should develop SI/HI, side effects, or if symptoms worsen. Has crisis numbers to call if needed. Pt verbalized understanding.   Other: F/up in 6 months or sooner if needed    Charlcie Cradle, MD 12/28/2014

## 2014-12-28 NOTE — Progress Notes (Signed)
[facility]  PATIENT: Allison Davies DOB: 27-Mar-1970  REFERRING CLINICIAN: S Deveshwar  HISTORY FROM: patient   REASON FOR VISIT: follow up    HISTORICAL  CHIEF COMPLAINT:  Chief Complaint  Patient presents with  . Follow-up    In room 7 by herself. Here for f/u memory. Memory is still her biggest concern. She is very forgetful. She goes to do a task and forgets what she went to go do. Has to write a lot down so she won't forget.      HISTORY OF PRESENT ILLNESS:   UPDATE 12/28/14: Since last visit, tried gabapentin (300mg  qhs not effective; BID was too strong). Compounded neuropathy cream too expensive. Now on CPAP for OSA. Memory still affected.  PRIOR HPI (11/10/14): 44 year old left-handed female with history of systemic lupus, Sjogren syndrome, prediabetes, here for evaluation of numbness and tingling, memory loss and headaches. Patient was diagnosed with autoimmune disease approximate 4 years ago. Patient is currently on Plaquenil and Imuran. Over past few months Sjogren's symptoms have flared up. Imuran was started last month. Patient has had numbness and tingling in the fingers, hands, feet for approximately 10 years. Symptoms are mild over many years but in the last 3 weeks these have significantly worsened. She wakes up in the morning sometimes with severe pain, numbness tingling, falling asleep sensation which last 10 minutes or so and then eases up. Patient has noted generalized joint pain and swelling. Her parotid glands are more tender. Over past 2 months patient has had more problems with memory, short-term problems, concentration and focus and attention. She is having to write notes and take more effort to remember previously easy to remember tasks. Patient also having more headaches intermittently over the past few months. Has had long history of snoring and interrupted sleep. She has significant daytime fatigue. She's never had a sleep study.    REVIEW  OF SYSTEMS: Full 14 system review of systems performed and notable only for fatigue swelling in legs ringing in ears snoring feeling hot increased thirst flushing joint pain joint swelling cramps aching muscles decreased energy difficulty swallowing numbness weakness headache memory loss sleepiness snoring.  ALLERGIES: Allergies  Allergen Reactions  . Lemon Extract [Flavoring Agent] Anaphylaxis  . Prochlorperazine Shortness Of Breath  . Prochlorperazine Edisylate Anaphylaxis    Compazine  . Codeine Nausea And Vomiting    severe  . Methotrexate Derivatives Nausea Only    Dizzy  . Promethazine Swelling  . Tramadol Other (See Comments)    Severe headache  . Ativan [Lorazepam] Palpitations  . Hydrocodone Palpitations    Patient states that she can not take narcotics. Tylenol is the only pain medication she can take.  . Isometheptene-Dichloral-Apap Palpitations    Darvocet  . Klonopin [Clonazepam] Palpitations  . Moxifloxacin Rash  . Penicillins Hives and Rash  . Plaquenil [Hydroxychloroquine] Palpitations  . Xanax [Alprazolam] Palpitations    HOME MEDICATIONS: Outpatient Prescriptions Prior to Visit  Medication Sig Dispense Refill  . acetaminophen (TYLENOL) 500 MG tablet Take 500 mg by mouth every 6 (six) hours as needed for moderate pain.     Marland Kitchen azaTHIOprine (IMURAN) 50 MG tablet 50 mg 2 (two) times daily.   2  . FLUoxetine (PROZAC) 20 MG capsule Take 1 capsule (20 mg total) by mouth daily. 90 capsule 1  . fluticasone (FLONASE) 50 MCG/ACT nasal spray Place 1 spray into both nostrils daily.    . folic acid (FOLVITE) 1 MG tablet Take 1 mg by mouth  daily.    . hydroxychloroquine (PLAQUENIL) 200 MG tablet Take 200 mg by mouth daily.    . Multiple Vitamin (MULTIVITAMIN) capsule Take 1 capsule by mouth daily.    . NON FORMULARY Fiber Gummy    . NONFORMULARY OR COMPOUNDED ITEM Baclofen 3%, Clonidine 0.2%, Diclofenac 4%, Gabapentin 6%, Ketamine 15%, Lidocaine 3% Musculoskeletal pain or  inflammation, [facility]    . OVER THE COUNTER MEDICATION Apply 1 application topically daily. Neutrogena  Ultra Sheer sunblock , uses everyday due to her Lupus    . pantoprazole (PROTONIX) 40 MG tablet Take 1 tablet (40 mg total) by mouth 2 (two) times daily. 90 tablet 0  . triamcinolone (KENALOG) 0.1 % paste Use as directed 1 application in the mouth or throat 2 (two) times daily. (Patient taking differently: Use as directed 1 application in the mouth or throat 2 (two) times daily as needed. ) 5 g 12  . cholecalciferol (VITAMIN D) 1000 UNITS tablet Take 1,000 Units by mouth daily.    Marland Kitchen gabapentin (NEURONTIN) 300 MG capsule Take 1 capsule (300 mg total) by mouth 2 (two) times daily. 60 capsule 3  . OVER THE COUNTER MEDICATION Apply 1 application topically as needed (for joint pain). 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 predniSONE (DELTASONE) 10 MG tablet Take 10 mg by mouth daily with breakfast. On tapper    . tiZANidine (ZANAFLEX) 2 MG tablet Take 2 mg by mouth every evening.     No facility-administered medications prior to visit.    PAST MEDICAL HISTORY: Past Medical History  Diagnosis Date  . GERD (gastroesophageal reflux disease)   . Lupus ([place])   . Morphea   . Joint pain   . Connective tissue disease ([place])     Lupus  . Fatty liver   . Blood glucose abnormal   . Muscle spasm of back   . Hiatal hernia   . Asthma     rarely uses inhaler  . Anxiety     no meds, history of  . Depression      no meds, history of  . Sjogren's syndrome ([place])   . Plantar fascia syndrome   . Ocular hypertension     PAST SURGICAL HISTORY: Past Surgical History  Procedure Laterality Date  . Appendectomy  1994  . Salpingectomy      bi-lateral  . Wisdom tooth extraction    . Hysteroscopy w/d&c N/A 10/16/2012    Procedure: DILATATION AND CURETTAGE /HYSTEROSCOPY;  Surgeon: Marylynn Pearson, MD;  Location: [facility];  Service: Gynecology;  Laterality: N/A;  .  Laparoscopic assisted vaginal hysterectomy N/A 04/28/2013    Procedure: LAPAROSCOPIC ASSISTED VAGINAL HYSTERECTOMY;  Surgeon: Thurnell Lose, MD;  Location: [facility];  Service: Gynecology;  Laterality: N/A;  . Vulva /perineum biopsy N/A 04/28/2013    Procedure: VULVAR BIOPSY;  Surgeon: Thurnell Lose, MD;  Location: [facility];  Service: Gynecology;  Laterality: N/A;    FAMILY HISTORY: Family History  Problem Relation Age of Onset  . Colon cancer Neg Hx   . Suicidality Neg Hx   . Diabetes Mother   . Heart disease Mother   . Depression Mother   . Diabetes Father   . Depression Father   . Diabetes Maternal Grandfather   . Heart disease Maternal Grandfather   . Diabetes Maternal Grandmother   . Heart disease Maternal Grandmother   . Heart disease Brother   . Hypertension Brother   .  Stroke Brother     SOCIAL HISTORY:  Social History   Social History  . Marital Status: Married    Spouse Name: N/A  . Number of Children: 3  . Years of Education: 14   Occupational History  . [employer]   Social History Main Topics  . Smoking status: Former Smoker -- 2.00 packs/day for 20 years    Types: Cigarettes    Quit date: 03/01/2003  . Smokeless tobacco: Never Used  . Alcohol Use: No  . Drug Use: No  . Sexual Activity: Yes    Birth Control/ Protection: Surgical   Other Topics Concern  . Not on file   Social History Narrative   Lives at home with spouse   Caffeine use- 1 diet Dr Malachi Bonds daily, 8 oz     PHYSICAL EXAM  GENERAL EXAM/CONSTITUTIONAL: Vitals:  Filed Vitals:   12/28/14 1253  BP: 108/69  Pulse: 83  Height: 5\' 6"  (1.676 m)  Weight: 197 lb 12.8 oz (89.721 kg)   Body mass index is 31.94 kg/(m^2). No exam data present  Patient is in no distress; well developed, nourished and groomed; neck is supple  BILATERAL PAROTID SWELLING  CARDIOVASCULAR:  Examination of carotid arteries is normal; no carotid bruits  Regular rate and rhythm, no  murmurs  Examination of peripheral vascular system by observation and palpation is normal  EYES:  Ophthalmoscopic exam of optic discs and posterior segments is normal; no papilledema or hemorrhages  MUSCULOSKELETAL:  Gait, strength, tone, movements noted in Neurologic exam below  NEUROLOGIC: MENTAL STATUS:  MMSE - Mini Mental State Exam 11/10/2014  Orientation to time 5  Orientation to Place 5  Registration 3  Attention/ Calculation 4  Recall 2  Language- name 2 objects 2  Language- repeat 1  Language- follow 3 step command 3  Language- read & follow direction 1  Write a sentence 1  Copy design 1  Total score 28    awake, alert, oriented to person, place and time  recent and remote memory intact  normal attention and concentration  language fluent, comprehension intact, naming intact,   fund of knowledge appropriate  CRANIAL NERVE:   2nd - no papilledema on fundoscopic exam  2nd, 3rd, 4th, 6th - pupils equal and reactive to light, visual fields full to confrontation, extraocular muscles intact, no nystagmus  5th - facial sensation symmetric  7th - facial strength symmetric  8th - hearing intact  9th - palate elevates symmetrically, uvula midline  11th - shoulder shrug symmetric  12th - tongue protrusion midline  MOTOR:   normal bulk and tone, full strength in the BUE, BLE  SENSORY:   normal and symmetric to light touch, pinprick, temperature, vibration; EXCEPT FOR DECR PP IN RIGHT HAND FINGERTIPS  COORDINATION:   finger-nose-finger, fine finger movements normal  REFLEXES:   deep tendon reflexes present and symmetric; BRISK AT KNEES  GAIT/STATION:   narrow based gait; able to walk tandem; romberg is negative    DIAGNOSTIC DATA (LABS, IMAGING, TESTING) - I reviewed patient records, labs, notes, testing and imaging myself where available.  Lab Results  Component Value Date   WBC 16.8* 04/29/2013   HGB 11.7* 04/29/2013   HCT 34.5*  04/29/2013   MCV 95.0 04/29/2013   PLT 198 04/29/2013      Component Value Date/Time   NA 140 04/29/2013 0515   K 4.2 04/29/2013 0515   CL 107 04/29/2013 0515   CO2 21 04/29/2013  0515   GLUCOSE 123* 04/29/2013 0515   BUN 10 04/29/2013 0515   CREATININE 0.68 04/29/2013 0515   CALCIUM 8.1* 04/29/2013 0515   PROT 7.3 12/10/2011 0350   ALBUMIN 3.8 12/10/2011 0350   AST 26 12/10/2011 0350   ALT 28 12/10/2011 0350   ALKPHOS 115 12/10/2011 0350   BILITOT 0.3 12/10/2011 0350   GFRNONAA >90 04/29/2013 0515   GFRAA >90 04/29/2013 0515   No results found for: CHOL, HDL, LDLCALC, LDLDIRECT, TRIG, CHOLHDL No results found for: HGBA1C Lab Results  Component Value Date   VITAMINB12 267 10/04/2009   Lab Results  Component Value Date   TSH 4.06 10/04/2009    10/30/07 CT head [I reviewed images myself and agree with interpretation. -[facility]]  - normal    ASSESSMENT AND PLAN  44 y.o. year old female here with autoimmune disease, lupus, Sjogren's syndrome, with long-standing numbness and tingling in the fingers and feet. Most likely represents neuropathy related to Sjogren's disease. Symptoms have increased and correlation with flareup of her other Sjogren's symptoms.  Also with memory and focus problems, decreased concentration, likely related to underlying pain, poor sleep, autoimmune disease as well.  Also with headaches which may be cervicogenic, migraine or tension headaches.   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    Penni Bombard, MD [id number], [id number] PM Certified in  Neurology, Neurophysiology and Neuroimaging  [facility] [address] [place], [place] [address] [phone]

## 2014-12-28 NOTE — Patient Instructions (Signed)
-   monitor symptoms

## 2015-01-27 ENCOUNTER — Encounter: Payer: Self-pay | Admitting: Internal Medicine

## 2015-02-11 ENCOUNTER — Telehealth: Payer: Self-pay | Admitting: Internal Medicine

## 2015-02-11 MED ORDER — AZITHROMYCIN 250 MG PO TABS: ORAL_TABLET | ORAL | Status: DC

## 2015-02-11 NOTE — Telephone Encounter (Signed)
Acute bilateral frontal and maxillary sinus pain beginning to get to upper teeth. Feels like sinusitis. Asks Z pak P- Z pak sent to [facility]

## 2015-03-04 DIAGNOSIS — G4733 Obstructive sleep apnea (adult) (pediatric): Secondary | ICD-10-CM | POA: Diagnosis not present

## 2015-03-08 DIAGNOSIS — G4733 Obstructive sleep apnea (adult) (pediatric): Secondary | ICD-10-CM | POA: Diagnosis not present

## 2015-03-21 DIAGNOSIS — M797 Fibromyalgia: Secondary | ICD-10-CM | POA: Diagnosis not present

## 2015-03-21 DIAGNOSIS — R300 Dysuria: Secondary | ICD-10-CM | POA: Diagnosis not present

## 2015-03-21 DIAGNOSIS — Z09 Encounter for follow-up examination after completed treatment for conditions other than malignant neoplasm: Secondary | ICD-10-CM | POA: Diagnosis not present

## 2015-03-21 DIAGNOSIS — M2550 Pain in unspecified joint: Secondary | ICD-10-CM | POA: Diagnosis not present

## 2015-03-21 DIAGNOSIS — G4709 Other insomnia: Secondary | ICD-10-CM | POA: Diagnosis not present

## 2015-03-21 DIAGNOSIS — Z79899 Other long term (current) drug therapy: Secondary | ICD-10-CM | POA: Diagnosis not present

## 2015-03-21 DIAGNOSIS — R5381 Other malaise: Secondary | ICD-10-CM | POA: Diagnosis not present

## 2015-03-21 MED FILL — predniSONE 5 MG TABS: 5 | 16 days supply | Qty: 40 | Fill #0

## 2015-04-01 DIAGNOSIS — M329 Systemic lupus erythematosus, unspecified: Secondary | ICD-10-CM | POA: Diagnosis not present

## 2015-04-01 DIAGNOSIS — E119 Type 2 diabetes mellitus without complications: Secondary | ICD-10-CM | POA: Diagnosis not present

## 2015-04-01 DIAGNOSIS — K219 Gastro-esophageal reflux disease without esophagitis: Secondary | ICD-10-CM | POA: Diagnosis not present

## 2015-04-01 DIAGNOSIS — R140 Abdominal distension (gaseous): Secondary | ICD-10-CM | POA: Diagnosis not present

## 2015-04-08 ENCOUNTER — Other Ambulatory Visit: Payer: Self-pay | Admitting: Internal Medicine

## 2015-04-08 DIAGNOSIS — G4733 Obstructive sleep apnea (adult) (pediatric): Secondary | ICD-10-CM

## 2015-04-08 NOTE — Progress Notes (Signed)
Based on CPAP download-per CY place order to [facility] to change CPAP auto settings for patient. CY spoke directly with patient. Order placed to DME.

## 2015-04-25 ENCOUNTER — Telehealth: Payer: Self-pay | Admitting: Internal Medicine

## 2015-04-25 DIAGNOSIS — G4733 Obstructive sleep apnea (adult) (pediatric): Secondary | ICD-10-CM

## 2015-04-25 NOTE — Telephone Encounter (Signed)
Per CY-okay to change pressure back as requested. Order placed and patient aware.

## 2015-04-25 NOTE — Addendum Note (Signed)
Addended by: Lorane Gell on: 04/25/2015 05:35 PM   Modules accepted: Orders

## 2015-04-26 ENCOUNTER — Encounter: Payer: Self-pay | Admitting: Internal Medicine

## 2015-04-27 DIAGNOSIS — H40013 Open angle with borderline findings, low risk, bilateral: Secondary | ICD-10-CM | POA: Diagnosis not present

## 2015-04-27 DIAGNOSIS — M329 Systemic lupus erythematosus, unspecified: Secondary | ICD-10-CM | POA: Diagnosis not present

## 2015-04-27 DIAGNOSIS — H04123 Dry eye syndrome of bilateral lacrimal glands: Secondary | ICD-10-CM | POA: Diagnosis not present

## 2015-04-27 DIAGNOSIS — H40053 Ocular hypertension, bilateral: Secondary | ICD-10-CM | POA: Diagnosis not present

## 2015-04-27 DIAGNOSIS — M3500 Sicca syndrome, unspecified: Secondary | ICD-10-CM | POA: Diagnosis not present

## 2015-04-27 DIAGNOSIS — M797 Fibromyalgia: Secondary | ICD-10-CM | POA: Diagnosis not present

## 2015-04-27 DIAGNOSIS — Z049 Encounter for examination and observation for unspecified reason: Secondary | ICD-10-CM | POA: Diagnosis not present

## 2015-04-27 DIAGNOSIS — Z09 Encounter for follow-up examination after completed treatment for conditions other than malignant neoplasm: Secondary | ICD-10-CM | POA: Diagnosis not present

## 2015-04-30 DIAGNOSIS — M329 Systemic lupus erythematosus, unspecified: Secondary | ICD-10-CM | POA: Diagnosis not present

## 2015-05-19 MED FILL — predniSONE 5 MG TABS: 5 | 30 days supply | Qty: 60 | Fill #0

## 2015-05-29 MED FILL — PANTOPRAZOLE SOD DR 40 MG T: 40 | 90 days supply | Qty: 180 | Fill #1

## 2015-05-29 MED FILL — FLUoxetine HCL 20 MG CAPS: 20 | 90 days supply | Qty: 90 | Fill #1

## 2015-05-31 MED FILL — tiZANidine HCL 4 MG TABS: 4 | 30 days supply | Qty: 30 | Fill #0

## 2015-06-14 MED FILL — VENTOLIN HFA 90 MCG INHALER: 108 (90 BAS | 30 days supply | Qty: 18 | Fill #0

## 2015-06-15 DIAGNOSIS — L821 Other seborrheic keratosis: Secondary | ICD-10-CM | POA: Diagnosis not present

## 2015-06-15 DIAGNOSIS — L940 Localized scleroderma [morphea]: Secondary | ICD-10-CM | POA: Diagnosis not present

## 2015-06-15 DIAGNOSIS — L814 Other melanin hyperpigmentation: Secondary | ICD-10-CM | POA: Diagnosis not present

## 2015-06-15 MED FILL — CLOBETASOL 0.05% OINTMENT: 0.05 | 30 days supply | Qty: 60 | Fill #0

## 2015-06-16 MED FILL — TACROLIMUS 0.1% OINTMENT: 0.1 | 30 days supply | Qty: 60 | Fill #0

## 2015-06-27 DIAGNOSIS — L309 Dermatitis, unspecified: Secondary | ICD-10-CM | POA: Diagnosis not present

## 2015-06-28 ENCOUNTER — Ambulatory Visit (HOSPITAL_COMMUNITY): Payer: Self-pay | Admitting: Psychiatry

## 2015-07-06 ENCOUNTER — Other Ambulatory Visit: Payer: Self-pay | Admitting: Internal Medicine

## 2015-07-06 DIAGNOSIS — G4733 Obstructive sleep apnea (adult) (pediatric): Secondary | ICD-10-CM

## 2015-07-06 NOTE — Progress Notes (Signed)
Pt spoke with Dr. Annamaria Boots this morning; had concerns with her CPAP pressure and needs new mask sent to [facility].   Per CY after reviewing CPAP download from [facility] he wants to decrease pressure to 12 and order new mask of choice for patient. Pt is aware of irder placed and nothing more needed at this time.

## 2015-07-09 DIAGNOSIS — M329 Systemic lupus erythematosus, unspecified: Secondary | ICD-10-CM | POA: Diagnosis not present

## 2015-07-19 ENCOUNTER — Ambulatory Visit (HOSPITAL_COMMUNITY): Payer: Self-pay | Admitting: Psychiatry

## 2015-07-19 DIAGNOSIS — G4733 Obstructive sleep apnea (adult) (pediatric): Secondary | ICD-10-CM | POA: Diagnosis not present

## 2015-08-19 ENCOUNTER — Encounter: Payer: Self-pay | Admitting: Internal Medicine

## 2015-08-19 ENCOUNTER — Other Ambulatory Visit (INDEPENDENT_AMBULATORY_CARE_PROVIDER_SITE_OTHER): Payer: 59

## 2015-08-19 ENCOUNTER — Ambulatory Visit (INDEPENDENT_AMBULATORY_CARE_PROVIDER_SITE_OTHER): Payer: 59 | Admitting: Internal Medicine

## 2015-08-19 VITALS — BP 130/84 | HR 84 | Temp 98.4°F | Resp 16 | Ht 66.0 in | Wt 200.0 lb

## 2015-08-19 DIAGNOSIS — E559 Vitamin D deficiency, unspecified: Secondary | ICD-10-CM

## 2015-08-19 DIAGNOSIS — M797 Fibromyalgia: Secondary | ICD-10-CM | POA: Insufficient documentation

## 2015-08-19 DIAGNOSIS — K219 Gastro-esophageal reflux disease without esophagitis: Secondary | ICD-10-CM

## 2015-08-19 DIAGNOSIS — J45909 Unspecified asthma, uncomplicated: Secondary | ICD-10-CM | POA: Insufficient documentation

## 2015-08-19 DIAGNOSIS — R7303 Prediabetes: Secondary | ICD-10-CM | POA: Insufficient documentation

## 2015-08-19 DIAGNOSIS — M329 Systemic lupus erythematosus, unspecified: Secondary | ICD-10-CM

## 2015-08-19 DIAGNOSIS — H40009 Preglaucoma, unspecified, unspecified eye: Secondary | ICD-10-CM

## 2015-08-19 DIAGNOSIS — R079 Chest pain, unspecified: Secondary | ICD-10-CM

## 2015-08-19 DIAGNOSIS — E669 Obesity, unspecified: Secondary | ICD-10-CM

## 2015-08-19 DIAGNOSIS — J4520 Mild intermittent asthma, uncomplicated: Secondary | ICD-10-CM

## 2015-08-19 DIAGNOSIS — M3500 Sicca syndrome, unspecified: Secondary | ICD-10-CM

## 2015-08-19 DIAGNOSIS — F4322 Adjustment disorder with anxiety: Secondary | ICD-10-CM

## 2015-08-19 DIAGNOSIS — E538 Deficiency of other specified B group vitamins: Secondary | ICD-10-CM

## 2015-08-19 DIAGNOSIS — F419 Anxiety disorder, unspecified: Secondary | ICD-10-CM

## 2015-08-19 LAB — CBC WITH DIFFERENTIAL/PLATELET
BASOS ABS: 0 10*3/uL (ref 0.0–0.1)
Basophils Relative: 0.4 % (ref 0.0–3.0)
EOS PCT: 3.1 % (ref 0.0–5.0)
Eosinophils Absolute: 0.2 10*3/uL (ref 0.0–0.7)
HEMATOCRIT: 39.3 % (ref 36.0–46.0)
Hemoglobin: 13.3 g/dL (ref 12.0–15.0)
LYMPHS PCT: 25.2 % (ref 12.0–46.0)
Lymphs Abs: 1.8 10*3/uL (ref 0.7–4.0)
MCHC: 33.8 g/dL (ref 30.0–36.0)
MCV: 90 fl (ref 78.0–100.0)
MONOS PCT: 5.9 % (ref 3.0–12.0)
Monocytes Absolute: 0.4 10*3/uL (ref 0.1–1.0)
NEUTROS ABS: 4.6 10*3/uL (ref 1.4–7.7)
Neutrophils Relative %: 65.4 % (ref 43.0–77.0)
PLATELETS: 202 10*3/uL (ref 150.0–400.0)
RBC: 4.36 Mil/uL (ref 3.87–5.11)
RDW: 13.2 % (ref 11.5–15.5)
WBC: 7.1 10*3/uL (ref 4.0–10.5)

## 2015-08-19 LAB — VITAMIN B12: Vitamin B-12: 399 pg/mL (ref 211–911)

## 2015-08-19 LAB — COMPREHENSIVE METABOLIC PANEL
ALT: 66 U/L — ABNORMAL HIGH (ref 0–35)
AST: 45 U/L — ABNORMAL HIGH (ref 0–37)
Albumin: 3.9 g/dL (ref 3.5–5.2)
Alkaline Phosphatase: 104 U/L (ref 39–117)
BUN: 12 mg/dL (ref 6–23)
CHLORIDE: 105 meq/L (ref 96–112)
CO2: 25 meq/L (ref 19–32)
Calcium: 8.7 mg/dL (ref 8.4–10.5)
Creatinine, Ser: 0.77 mg/dL (ref 0.40–1.20)
GFR: 85.73 mL/min (ref >60.00)
GLUCOSE: 290 mg/dL — AB (ref 70–99)
POTASSIUM: 3.6 meq/L (ref 3.5–5.1)
SODIUM: 135 meq/L (ref 135–145)
Total Bilirubin: 0.4 mg/dL (ref 0.2–1.2)
Total Protein: 6.6 g/dL (ref 6.0–8.3)

## 2015-08-19 LAB — HEMOGLOBIN A1C: HEMOGLOBIN A1C: 8.4 % — AB (ref 4.6–6.5)

## 2015-08-19 LAB — TSH: TSH: 2.35 u[IU]/mL (ref 0.35–4.50)

## 2015-08-19 LAB — VITAMIN D 25 HYDROXY (VIT D DEFICIENCY, FRACTURES): VITD: 24.29 ng/mL — ABNORMAL LOW (ref 30.00–100.00)

## 2015-08-19 MED ORDER — ALBUTEROL SULFATE HFA 108 (90 BASE) MCG/ACT IN AERS: 2.0000 | INHALATION_SPRAY | Freq: Four times a day (QID) | RESPIRATORY_TRACT | Status: AC | PRN

## 2015-08-19 MED ORDER — FLUOXETINE HCL 20 MG PO CAPS: 20.0000 mg | ORAL_CAPSULE | Freq: Every day | ORAL | Status: DC

## 2015-08-19 MED FILL — FLUoxetine HCL 20 MG CAPS: 20 | 90 days supply | Qty: 90 | Fill #0 | Status: TO

## 2015-08-19 NOTE — Patient Instructions (Signed)
  Test(s) ordered today. Your results will be released to MyChart (or called to you) after review, usually within 72hours after test completion. If any changes need to be made, you will be notified at that same time.   Medications reviewed and updated.  No changes recommended at this time.    Please followup in 3 months   

## 2015-08-19 NOTE — Progress Notes (Signed)
Pre visit review using our clinic review tool, if applicable. No additional management support is needed unless otherwise documented below in the visit note. 

## 2015-08-19 NOTE — Assessment & Plan Note (Signed)
Controlled, stable Continue current dose of medication  

## 2015-08-19 NOTE — Assessment & Plan Note (Addendum)
GERD controlled - ? She feels it is controlled, but she does have a history of silent gerd and has had a cough for no reason -- advised her to monitor closely and consider adding zantac at night to see if that helps her cough Continue daily medication

## 2015-08-19 NOTE — Progress Notes (Signed)
Subjective:    Patient ID: Allison Davies, female    DOB: [birth year], 45 y.o.   MRN: [id number]  HPI She is here to establish with a new pcp.    Lupus, fibromyalgia, sjogrens:  She has been on methotrexate, imuran and plaquenil and she did not either tolerate them or they did not work.  She is following with Dr Amil Amen.  She would like to stay of medication and currently feels she is doing ok off of medication.    Prediabetes, diabetes:  She is exercising intermittently and tries to exercise, but her joint pain limits her.  She has not been compliant with a low sugar diet.  She tends to eat healthy, but recently has been on a sugar binge.  She has gained weight and knows she needs to lose weight. She has a strong family history of diabetes.  She has always been a prediabetic, but at home her recent sugars have been in the 200's.  She does have increased urination/thirst.   Cough: she started coughing three weeks ago.  She will have a cough attacks out of the blue.  She has rawness in her throat and she feels like she is going to vomit.  She uses albuterol inhaler and it helps.    GERD:  She is taking her medication twice daily as prescribed.  She denies any GERD symptoms and feels her GERD is well controlled. She has had silent GERD in the past.   OSA: She is using CPAP at night.    Anxiety: She is taking her medication daily as prescribed. She denies any side effects from the medication. She feels her anxiety is well controlled and she is happy with her current dose of medication. She has been seeing a psychiatrist, but would like to get the medication from me if possible.    Medications and allergies reviewed with patient and updated if appropriate.  Patient Active Problem List   Diagnosis Date Noted  . Obstructive sleep apnea 11/19/2014  . Systemic lupus erythematosus ([place]) 11/19/2014  . H/O oral aphthous ulcers 11/19/2014  . Adjustment disorder with anxious mood 02/16/2014  . S/P  laparoscopic assisted vaginal hysterectomy (LAVH) 04/28/2013  . Dizziness and giddiness 01/08/2012  . Disturbance of skin sensation 01/08/2012  . Hematemesis 12/10/2011  . ANXIETY 10/04/2009  . ABDOMINAL PAIN, EPIGASTRIC 10/04/2009    Current Outpatient Prescriptions on File Prior to Visit  Medication Sig Dispense Refill  . acetaminophen (TYLENOL) 500 MG tablet Take 500 mg by mouth every 6 (six) hours as needed for moderate pain.     Marland Kitchen FLUoxetine (PROZAC) 20 MG capsule Take 1 capsule (20 mg total) by mouth daily. 90 capsule 1  . NON FORMULARY Fiber Gummy    . OVER THE COUNTER MEDICATION Apply 1 application topically daily. Neutrogena  Ultra Sheer sunblock , uses everyday due to her Lupus    . pantoprazole (PROTONIX) 40 MG tablet Take 1 tablet (40 mg total) by mouth 2 (two) times daily. 90 tablet 0   No current facility-administered medications on file prior to visit.    Past Medical History  Diagnosis Date  . GERD (gastroesophageal reflux disease)   . Lupus ([place])   . Morphea   . Joint pain   . Connective tissue disease ([place])     Lupus  . Fatty liver   . Blood glucose abnormal   . Muscle spasm of back   . Hiatal hernia   . Asthma  rarely uses inhaler  . Anxiety     no meds, history of  . Depression      no meds, history of  . Sjogren's syndrome ([place])   . Plantar fascia syndrome   . Ocular hypertension   . Fibromyalgia 12/28/14  . Bursitis of both hips 12/28/14  . Sleep apnea     Past Surgical History  Procedure Laterality Date  . Appendectomy  1994  . Salpingectomy      bi-lateral  . Wisdom tooth extraction    . Hysteroscopy w/d&c N/A 10/16/2012    Procedure: DILATATION AND CURETTAGE /HYSTEROSCOPY;  Surgeon: Marylynn Pearson, MD;  Location: [facility];  Service: Gynecology;  Laterality: N/A;  . Laparoscopic assisted vaginal hysterectomy N/A 04/28/2013    Procedure: LAPAROSCOPIC ASSISTED VAGINAL HYSTERECTOMY;  Surgeon: Thurnell Lose, MD;  Location: [facility];  Service:  Gynecology;  Laterality: N/A;  . Vulva /perineum biopsy N/A 04/28/2013    Procedure: VULVAR BIOPSY;  Surgeon: Thurnell Lose, MD;  Location: [facility];  Service: Gynecology;  Laterality: N/A;    Social History   Social History  . Marital Status: Married    Spouse Name: N/A  . Number of Children: 3  . Years of Education: 14   Occupational History  . [employer]   Social History Main Topics  . Smoking status: Former Smoker -- 2.00 packs/day for 20 years    Types: Cigarettes    Quit date: 03/01/2003  . Smokeless tobacco: Never Used  . Alcohol Use: No  . Drug Use: No  . Sexual Activity: Yes    Birth Control/ Protection: Surgical   Other Topics Concern  . Not on file   Social History Narrative   Lives at home with spouse   Caffeine use- 1 diet Dr Malachi Bonds daily, 8 oz    Family History  Problem Relation Age of Onset  . Colon cancer Neg Hx   . Suicidality Neg Hx   . Diabetes Mother   . Heart disease Mother   . Depression Mother   . Diabetes Father   . Depression Father   . Diabetes Maternal Grandfather   . Heart disease Maternal Grandfather   . Diabetes Maternal Grandmother   . Heart disease Maternal Grandmother   . Heart disease Brother   . Hypertension Brother   . Stroke Brother     Review of Systems  Constitutional: Negative for fever.  Eyes: Negative for visual disturbance.  Respiratory: Positive for cough. Negative for shortness of breath and wheezing.   Cardiovascular: Positive for chest pain (sometimes relieved with heartburn medication), palpitations (occasional especially with exertion) and leg swelling.  Gastrointestinal: Negative for nausea, abdominal pain, diarrhea, constipation and blood in stool.       No gerd  Endocrine: Positive for polydipsia and polyuria.  Genitourinary: Negative for dysuria and hematuria.  Musculoskeletal: Positive for myalgias, back pain, joint swelling (at times) and arthralgias.  Skin: Positive for rash  (intermittent butterfly rash).  Neurological: Positive for headaches (one episode recently - severe in nature). Negative for dizziness, light-headedness and numbness.  Psychiatric/Behavioral: Negative for dysphoric mood. The patient is nervous/anxious (controlled).        Objective:   Filed Vitals:   08/19/15 1259  BP: 130/84  Pulse: 84  Temp: 98.4 F (36.9 C)  Resp: 16   Filed Weights   08/19/15 1259  Weight: 200 lb (90.719 kg)   Body mass index is 32.3 kg/(m^2).   Physical Exam Constitutional: She  appears well-developed and well-nourished. No distress.  HENT:  Head: Normocephalic and atraumatic.  Right Ear: External ear normal. Normal ear canal and TM Left Ear: External ear normal.  Normal ear canal and TM Mouth/Throat: Oropharynx is clear and moist.  Eyes: Conjunctivae normal.  Neck: Neck supple. No tracheal deviation present. No thyromegaly present.  No carotid bruit  Cardiovascular: Normal rate, regular rhythm and normal heart sounds.   No murmur heard.  No edema. Pulmonary/Chest: Chest wall non tender.  Effort normal and breath sounds normal. No respiratory distress. She has no wheezes. She has no rales.  Abdominal: Soft. She exhibits no distension. There is no tenderness.  Lymphadenopathy: She has no cervical adenopathy.  Skin: Skin is warm and dry. She is not diaphoretic.  Psychiatric: She has a normal mood and affect. Her behavior is normal.         Assessment & Plan:   See Problem List for Assessment and Plan of chronic medical problems.  F/u in 3 months

## 2015-08-19 NOTE — Assessment & Plan Note (Addendum)
Sugars at home recently have been over 200 - may have developed diabetes Strong family history of diabetes  Check a1c Deferred nutrition referral Stressed exercise - walking, pilates, yoga - -she does do some biking Weight loss Decrease sugar intake

## 2015-08-20 ENCOUNTER — Encounter: Payer: Self-pay | Admitting: Internal Medicine

## 2015-08-20 DIAGNOSIS — R079 Chest pain, unspecified: Secondary | ICD-10-CM | POA: Insufficient documentation

## 2015-08-20 DIAGNOSIS — H40009 Preglaucoma, unspecified, unspecified eye: Secondary | ICD-10-CM | POA: Insufficient documentation

## 2015-08-20 DIAGNOSIS — E119 Type 2 diabetes mellitus without complications: Secondary | ICD-10-CM | POA: Insufficient documentation

## 2015-08-20 DIAGNOSIS — E669 Obesity, unspecified: Secondary | ICD-10-CM | POA: Insufficient documentation

## 2015-08-20 NOTE — Assessment & Plan Note (Signed)
Management per Dr. Beekman. 

## 2015-08-20 NOTE — Assessment & Plan Note (Signed)
Management per Dr Amil Amen Encouraged regular exercise

## 2015-08-20 NOTE — Assessment & Plan Note (Signed)
Check vitamin d level

## 2015-08-20 NOTE — Assessment & Plan Note (Signed)
Has had a stress test several years ago that was negative, but report not available and it was several years ago Strong family history of CAD Will refer to cardio for possible stress test - may need nuclear stress

## 2015-08-20 NOTE — Assessment & Plan Note (Signed)
Using albuterol inhaler as needed

## 2015-08-20 NOTE — Assessment & Plan Note (Signed)
Not on any medication Does have joint pain Management per Dr Amil Amen

## 2015-08-20 NOTE — Assessment & Plan Note (Signed)
Exercise, decrease portions Stressed weight loss

## 2015-08-20 NOTE — Assessment & Plan Note (Signed)
Taking B12 supplement Check B12 level

## 2015-08-22 ENCOUNTER — Encounter: Payer: Self-pay | Admitting: Internal Medicine

## 2015-08-22 DIAGNOSIS — M3500 Sicca syndrome, unspecified: Secondary | ICD-10-CM | POA: Diagnosis not present

## 2015-08-22 DIAGNOSIS — M329 Systemic lupus erythematosus, unspecified: Secondary | ICD-10-CM | POA: Diagnosis not present

## 2015-08-22 DIAGNOSIS — M797 Fibromyalgia: Secondary | ICD-10-CM | POA: Diagnosis not present

## 2015-08-22 MED FILL — HYDROXYCHLOROQUINE 200 MG T: 200 | 60 days supply | Qty: 30 | Fill #0 | Status: TO

## 2015-08-23 ENCOUNTER — Telehealth: Payer: Self-pay | Admitting: Internal Medicine

## 2015-08-23 ENCOUNTER — Encounter: Payer: Self-pay | Admitting: Family Medicine

## 2015-08-23 ENCOUNTER — Other Ambulatory Visit: Payer: Self-pay | Admitting: Internal Medicine

## 2015-08-23 ENCOUNTER — Other Ambulatory Visit: Payer: Self-pay | Admitting: *Deleted

## 2015-08-23 ENCOUNTER — Ambulatory Visit (INDEPENDENT_AMBULATORY_CARE_PROVIDER_SITE_OTHER)
Admission: RE | Admit: 2015-08-23 | Discharge: 2015-08-23 | Disposition: A | Discharge status: Home / Self Care | Payer: 59 | Source: Ambulatory Visit | Attending: Internal Medicine | Admitting: Internal Medicine

## 2015-08-23 DIAGNOSIS — R079 Chest pain, unspecified: Secondary | ICD-10-CM | POA: Diagnosis not present

## 2015-08-23 DIAGNOSIS — IMO0002 Reserved for concepts with insufficient information to code with codable children: Secondary | ICD-10-CM

## 2015-08-23 DIAGNOSIS — M329 Systemic lupus erythematosus, unspecified: Secondary | ICD-10-CM

## 2015-08-23 DIAGNOSIS — R05 Cough: Secondary | ICD-10-CM | POA: Diagnosis not present

## 2015-08-23 MED ORDER — ONETOUCH ULTRASOFT LANCETS MISC: 1.0000 | Freq: Two times a day (BID) | Status: DC

## 2015-08-23 MED ORDER — GLUCOSE BLOOD VI STRP: 1.0000 | ORAL_STRIP | Freq: Two times a day (BID) | Status: DC

## 2015-08-23 MED ORDER — ALCOHOL PADS 70 % PADS: MEDICATED_PAD | Status: AC

## 2015-08-23 MED FILL — SM ALCOHOL 70% PREP PADS: 70 | 50 days supply | Qty: 100 | Fill #0

## 2015-08-23 MED FILL — ONE TOUCH DELICA 33G LANCET: 50 days supply | Qty: 100 | Fill #0

## 2015-08-23 MED FILL — ONE TOUCH ULTRA TEST STRIPS: 90 days supply | Qty: 100 | Fill #0

## 2015-08-23 NOTE — Telephone Encounter (Signed)
Pt's rheumatologist asked we do CXR for concern of persistent cough with hx lupus

## 2015-08-23 NOTE — Telephone Encounter (Signed)
CXR today reviewed with Allison Davies, shows a diffuse bronchitis/ bronchiolitis with some acinar prominence. Very non-specific, but since her cough started about 3 weeks ago, an acute viral infection is possible. Cannot exclude an early interstitial process such as lupus, or an atypical infection. Her course will determine if more evaluation is needed. If this is viral, it should resolve over next couple of weeks. I have given her sample Breo Ellipta 200, to inhale 1 puff then rinse mouth, once daily x 14 days. CC incl xray report to her Rheumatologist Dr Amil Amen.

## 2015-08-23 NOTE — Telephone Encounter (Signed)
Sent msg back to pt rx sent to [facility]...Allison Davies

## 2015-08-23 NOTE — Telephone Encounter (Signed)
Pt sent email needing refill sent on her one touch strips & lancets & al;cohol pads.Sent to [facility].....Johny Chess

## 2015-09-02 ENCOUNTER — Encounter: Payer: Self-pay | Admitting: *Deleted

## 2015-09-02 ENCOUNTER — Encounter: Payer: Self-pay | Admitting: Cardiology

## 2015-09-02 ENCOUNTER — Ambulatory Visit (INDEPENDENT_AMBULATORY_CARE_PROVIDER_SITE_OTHER): Payer: 59 | Admitting: Cardiology

## 2015-09-02 VITALS — BP 132/74 | HR 73 | Ht 66.0 in | Wt 194.0 lb

## 2015-09-02 DIAGNOSIS — R7989 Other specified abnormal findings of blood chemistry: Secondary | ICD-10-CM | POA: Insufficient documentation

## 2015-09-02 DIAGNOSIS — E785 Hyperlipidemia, unspecified: Secondary | ICD-10-CM

## 2015-09-02 DIAGNOSIS — R079 Chest pain, unspecified: Secondary | ICD-10-CM

## 2015-09-02 DIAGNOSIS — M329 Systemic lupus erythematosus, unspecified: Secondary | ICD-10-CM | POA: Diagnosis not present

## 2015-09-02 DIAGNOSIS — Z8249 Family history of ischemic heart disease and other diseases of the circulatory system: Secondary | ICD-10-CM | POA: Insufficient documentation

## 2015-09-02 DIAGNOSIS — E119 Type 2 diabetes mellitus without complications: Secondary | ICD-10-CM

## 2015-09-02 DIAGNOSIS — R945 Abnormal results of liver function studies: Secondary | ICD-10-CM

## 2015-09-02 MED ORDER — CINNAMON 500 MG PO TABS: 500.0000 mg | ORAL_TABLET | Freq: Every day | ORAL | Status: DC

## 2015-09-02 MED ORDER — HYDROXYCHLOROQUINE SULFATE 200 MG PO TABS: 100.0000 mg | ORAL_TABLET | Freq: Every day | ORAL | Status: AC

## 2015-09-02 NOTE — Addendum Note (Signed)
Addended by: Michae Kava on: 09/02/2015 01:35 PM   Modules accepted: Orders, Medications

## 2015-09-02 NOTE — Progress Notes (Signed)
Cardiology Office Note    Date:  09/02/2015   ID:  Allison, Davies January 05, 1971, MRN [id number]  PCP:  Binnie Rail, MD  Cardiologist:  Ena Dawley, MD  Referring physician: Binnie Rail, MD   Chief complaint: Chest Pain and fatigue, dyspnea on exertion   History of Present Illness:  Allison Davies is a 45 y.o. female with prior medical history systemic lupus erythematosus, diabetes mellitus, untreated hyperlipidemia, significant family history of premature coronary artery disease in multiple family members, Sjogren's syndrome, sleep apnea who is coming with concerns of chest pain. The patient has noticed profound fatigue that started several months ago and also chest pain. She has noticed when she has to walk the same distance from the parking lot more work she gets just profoundly tired and dizzy and short of breath. She also noticed chest pressure. Sometimes she get chest pressure at rest as well with radiation to her left arm. She just feels like laying on the floor and sleeping all the time, by the end of her workday she has to sit down to catch her breath. She states that in the past her triglycerides were in 300s, however never treated. She is being treated for systemic lupus erythematosus currently on Plaquenil only. Really concerned as multiple members of her family died of myocardial infarction, her father recently had quadruple bypass in his only presentation was fatigue. He is a former smoker smoked for 20 years but quit 12 years ago. She drinks no alcohol. She has recently tried to lose some weight and has lost 6 pounds so far. She had hysterectomy 2 years ago.  Father CABG x 4 in his 83' after an abnormal stress test, Maternal GM - MI in her 35' , GF - MI at 67', died at 53. Brother had stroke at 35 years old.  Past Medical History  Diagnosis Date  . GERD (gastroesophageal reflux disease)   . Lupus ([place])   . Morphea   . Joint pain   . Connective tissue disease  ([place])     Lupus  . Fatty liver   . Blood glucose abnormal   . Muscle spasm of back   . Hiatal hernia   . Asthma     rarely uses inhaler  . Anxiety     no meds, history of  . Depression      no meds, history of  . Sjogren's syndrome ([place])   . Plantar fascia syndrome   . Ocular hypertension   . Fibromyalgia 12/28/14  . Bursitis of both hips 12/28/14  . Sleep apnea    Past Surgical History  Procedure Laterality Date  . Salpingectomy      bi-lateral  . Wisdom tooth extraction    . Hysteroscopy w/d&c N/A 10/16/2012    Procedure: DILATATION AND CURETTAGE /HYSTEROSCOPY;  Surgeon: Marylynn Pearson, MD;  Location: [facility];  Service: Gynecology;  Laterality: N/A;  . Laparoscopic assisted vaginal hysterectomy N/A 04/28/2013    Procedure: LAPAROSCOPIC ASSISTED VAGINAL HYSTERECTOMY;  Surgeon: Thurnell Lose, MD;  Location: [facility];  Service: Gynecology;  Laterality: N/A;  . Vulva /perineum biopsy N/A 04/28/2013    Procedure: VULVAR BIOPSY;  Surgeon: Thurnell Lose, MD;  Location: [facility];  Service: Gynecology;  Laterality: N/A;  . Abdominal hysterectomy  2014  . Appendectomy  1994   Current Medications: Outpatient Prescriptions Prior to Visit  Medication Sig Dispense Refill  . acetaminophen (TYLENOL) 500 MG tablet Take 500 mg by mouth every 6 (six)  hours as needed for moderate pain.     Marland Kitchen albuterol (PROVENTIL HFA;VENTOLIN HFA) 108 (90 Base) MCG/ACT inhaler Inhale 2 puffs into the lungs every 6 (six) hours as needed for wheezing or shortness of breath. 1 Inhaler 0  . Alcohol Swabs (ALCOHOL PADS) 70 % PADS Use to clean site to check blood sugars twice a day Dx E11.9 100 each 3  . FLUoxetine (PROZAC) 20 MG capsule Take 1 capsule (20 mg total) by mouth daily. 90 capsule 1  . glucose blood (ONE TOUCH ULTRA TEST) test strip 1 each by Other route 2 (two) times daily. Use to check blood sugars twice a day  Dx E11.9 100 each 3  . Lancets (ONETOUCH ULTRASOFT) lancets 1 each by Other route 2 (two) times  daily. Use to help check blood sugars twice a day Dx E11.9 100 each 3  . NON FORMULARY Fiber Gummy    . OVER THE COUNTER MEDICATION Apply 1 application topically daily. Neutrogena  Ultra Sheer sunblock , uses everyday due to her Lupus    . pantoprazole (PROTONIX) 40 MG tablet Take 1 tablet (40 mg total) by mouth 2 (two) times daily. 90 tablet 0   No facility-administered medications prior to visit.    Allergies:   Lemon extract; Prochlorperazine; Prochlorperazine edisylate; Codeine; Methotrexate derivatives; Promethazine; Tramadol; Ativan; Hydrocodone; Isometheptene-dichloral-apap; Klonopin; Moxifloxacin; Penicillins; Plaquenil; and Xanax   Social History   Social History  . Marital Status: Married    Spouse Name: N/A  . Number of Children: 3  . Years of Education: 14   Occupational History  . [employer]   Social History Main Topics  . Smoking status: Former Smoker -- 2.00 packs/day for 20 years    Types: Cigarettes    Quit date: 03/01/2003  . Smokeless tobacco: Never Used  . Alcohol Use: No  . Drug Use: No  . Sexual Activity: Yes    Birth Control/ Protection: Surgical   Other Topics Concern  . None   Social History Narrative   Lives at home with spouse   Caffeine use- 1 diet Dr Malachi Bonds daily, 8 oz     Family History:  The patient's family history includes Depression in her father and mother; Diabetes in her father, maternal grandfather, maternal grandmother, and mother; Heart disease in her brother, maternal grandfather, maternal grandmother, and mother; Hypertension in her brother; Stroke in her brother. There is no history of Colon cancer or Suicidality.   ROS:   Please see the history of present illness.    ROS All other systems reviewed and are negative.   PHYSICAL EXAM:   VS:  BP 132/74 mmHg  Pulse 73  Ht _0  (1.676 m)  Wt 194 lb (87.998 kg)  BMI 31.33 kg/m2  LMP 01/20/2013   GEN: Well nourished, well developed, in no acute  distress HEENT: normal Neck: no JVD, carotid bruits, or masses Cardiac: RRR; no murmurs, rubs, or gallops,no edema  Respiratory:  clear to auscultation bilaterally, normal work of breathing GI: soft, nontender, nondistended, + BS MS: no deformity or atrophy Skin: warm and dry, no rash Neuro:  Alert and Oriented x 3, Strength and sensation are intact Psych: euthymic mood, full affect  Wt Readings from Last 3 Encounters:  09/02/15 194 lb (87.998 kg)  08/19/15 200 lb (90.719 kg)  12/28/14 197 lb 3.2 oz (89.449 kg)      Studies/Labs Reviewed:   EKG:  EKG is ordered today.  The ekg ordered  today demonstrates Sinus rhythm, nonspecific T-wave abnormalities in the anterior leads that are compared well compared to prior EKG in 2013.  Recent Labs: 08/19/2015: ALT 66*; BUN 12; Creatinine, Ser 0.77; Hemoglobin 13.3; Platelets 202.0; Potassium 3.6; Sodium 135; TSH 2.35   Lipid Panel No results found for: CHOL, TRIG, HDL, CHOLHDL, VLDL, LDLCALC, LDLDIRECT      ASSESSMENT:    1. FH: CAD (coronary artery disease)   2. Chest pain, unspecified chest pain type   3. SLE (systemic lupus erythematosus) ([place])   4. HLD (hyperlipidemia)   5. Diabetes mellitus with no complication ([facility])   6. Elevated LFTs      PLAN:  In order of problems listed above:  1. This is a 45 year old female who presents with typical exertional retrosternal chest pain shortness of breath with significant fatigue. Her EKG shows only nonspecific T-wave abnormalities in the anterior leads that are new when compared to prior EKG. Her risk factors are significant including being postmenopausal female with untreated hyperlipidemia, diabetes, multiple ultra immune diseases including systemic lupus erythematosus being a former smoker and having at least 3 met family members with premature coronary artery disease. 2. I would schedule a cardiac cath to evaluate for degree of coronary artery disease and is nonobstructive treated  aggressively, she should be on at least moderate dose of highly potential statin being a diabetic female, however LFTs are elevated. We will recheck her LFTs and lipids and treat appropriately.   Medication Adjustments/Labs and Tests Ordered: Current medicines are reviewed at length with the patient today.  Concerns regarding medicines are outlined above.  Medication changes, Labs and Tests ordered today are listed in the Patient Instructions below. Patient Instructions  Medication Instructions:  1. Your physician recommends that you continue on your current medications as directed. Please refer to the Current Medication list given to you today.   Labwork: 1. FASTING LAB WORK TO BE DONE ON Monday 09/05/15 ; FLP/LFT, CBC W/DIFF, TSH, INR  Testing/Procedures: Your physician has requested that you have a cardiac catheterization. Cardiac catheterization is used to diagnose and/or treat various heart conditions. Doctors may recommend this procedure for a number of different reasons. The most common reason is to evaluate chest pain. Chest pain can be a symptom of coronary artery disease (CAD), and cardiac catheterization can show whether plaque is narrowing or blocking your heart's arteries. This procedure is also used to evaluate the valves, as well as measure the blood flow and oxygen levels in different parts of your heart. For further information please visit [facility]uge[facility]iesta[facility]tn. Please follow instruction sheet, as given.  CALL THE OFFICE WHEN YOU ARE READY TO SCHEDULE CATH  Follow-Up: DR. Meda Coffee 11/18/15 @ 8 AM  Any Other Special Instructions Will Be Listed Below (If Applicable).     If you need a refill on your cardiac medications before your next appointment, please call your pharmacy.       Signed, Ena Dawley, MD  09/02/2015 11:04 AM    [facility] [address], [place], [place]  [address] Phone: [phone]; Fax: [phone]

## 2015-09-02 NOTE — Patient Instructions (Addendum)
Medication Instructions:  1. Your physician recommends that you continue on your current medications as directed. Please refer to the Current Medication list given to you today.   Labwork: 1. FASTING LAB WORK TO BE DONE ON Monday 09/05/15 ; FLP/LFT, CBC W/DIFF, TSH, INR  Testing/Procedures: Your physician has requested that you have a cardiac catheterization. Cardiac catheterization is used to diagnose and/or treat various heart conditions. Doctors may recommend this procedure for a number of different reasons. The most common reason is to evaluate chest pain. Chest pain can be a symptom of coronary artery disease (CAD), and cardiac catheterization can show whether plaque is narrowing or blocking your heart's arteries. This procedure is also used to evaluate the valves, as well as measure the blood flow and oxygen levels in different parts of your heart. For further information please visit [facility]tn. Please follow instruction sheet, as given.  CALL THE OFFICE WHEN YOU ARE READY TO SCHEDULE CATH;   ADDENDUM: YOU HAVE BEEN SCHEDULED FOR LEFT HEART CATH 09/09/15 @ 9 AM WITH DR. VARANASI. WENT OVER INSTRUCTIONS BY PHONE WITH YOU TODAY @ 1:21 PM. I WILL PLACE INSTRUCTIONS AT FRONT DESK FOR PT PICK ON Monday 09/05/15 WHEN SHE COMES IN FOR HER LAB WORK.   Follow-Up: DR. Meda Coffee 11/18/15 @ 8 AM  Any Other Special Instructions Will Be Listed Below (If Applicable).     If you need a refill on your cardiac medications before your next appointment, please call your pharmacy.

## 2015-09-02 NOTE — Addendum Note (Signed)
Addended by: Dorothy Spark on: 09/02/2015 02:12 PM   Modules accepted: Orders

## 2015-09-05 ENCOUNTER — Other Ambulatory Visit: Payer: 59 | Admitting: *Deleted

## 2015-09-05 DIAGNOSIS — M329 Systemic lupus erythematosus, unspecified: Secondary | ICD-10-CM

## 2015-09-05 DIAGNOSIS — E785 Hyperlipidemia, unspecified: Secondary | ICD-10-CM | POA: Diagnosis not present

## 2015-09-05 LAB — HEPATIC FUNCTION PANEL
ALT: 80 U/L — ABNORMAL HIGH (ref 6–29)
AST: 70 U/L — ABNORMAL HIGH (ref 10–35)
Albumin: 4.3 g/dL (ref 3.6–5.1)
Alkaline Phosphatase: 99 U/L (ref 33–115)
Bilirubin, Direct: 0.1 mg/dL (ref <=0.2)
Indirect Bilirubin: 0.5 mg/dL (ref 0.2–1.2)
Total Bilirubin: 0.6 mg/dL (ref 0.2–1.2)
Total Protein: 6.6 g/dL (ref 6.1–8.1)

## 2015-09-05 LAB — CBC WITH DIFFERENTIAL/PLATELET
Basophils Absolute: 60 cells/uL (ref 0–200)
Basophils Relative: 1 %
Eosinophils Absolute: 180 cells/uL (ref 15–500)
Eosinophils Relative: 3 %
HCT: 44.1 % (ref 35.0–45.0)
Hemoglobin: 14.7 g/dL (ref 11.7–15.5)
Lymphocytes Relative: 21 %
Lymphs Abs: 1260 cells/uL (ref 850–3900)
MCH: 30.8 pg (ref 27.0–33.0)
MCHC: 33.3 g/dL (ref 32.0–36.0)
MCV: 92.5 fL (ref 80.0–100.0)
MPV: 12.2 fL (ref 7.5–12.5)
Monocytes Absolute: 300 cells/uL (ref 200–950)
Monocytes Relative: 5 %
Neutro Abs: 4200 cells/uL (ref 1500–7800)
Neutrophils Relative %: 70 %
Platelets: 249 10*3/uL (ref 140–400)
RBC: 4.77 MIL/uL (ref 3.80–5.10)
RDW: 13.9 % (ref 11.0–15.0)
WBC: 6 10*3/uL (ref 3.8–10.8)

## 2015-09-05 LAB — BASIC METABOLIC PANEL
BUN: 10 mg/dL (ref 7–25)
CO2: 24 mmol/L (ref 20–31)
Calcium: 8.8 mg/dL (ref 8.6–10.2)
Chloride: 105 mmol/L (ref 98–110)
Creat: 0.84 mg/dL (ref 0.50–1.10)
Glucose, Bld: 186 mg/dL — ABNORMAL HIGH (ref 65–99)
Potassium: 3.8 mmol/L (ref 3.5–5.3)
Sodium: 138 mmol/L (ref 135–146)

## 2015-09-05 LAB — LIPID PANEL
Cholesterol: 182 mg/dL (ref 125–200)
HDL: 33 mg/dL — ABNORMAL LOW (ref >=46)
LDL Cholesterol: 118 mg/dL (ref <130)
Total CHOL/HDL Ratio: 5.5 Ratio — ABNORMAL HIGH (ref <=5.0)
Triglycerides: 155 mg/dL — ABNORMAL HIGH (ref <150)
VLDL: 31 mg/dL — ABNORMAL HIGH (ref <30)

## 2015-09-05 LAB — PROTIME-INR
INR: 1.1
Prothrombin Time: 11.2 s (ref 9.0–11.5)

## 2015-09-05 LAB — TSH: TSH: 3.93 mIU/L

## 2015-09-06 ENCOUNTER — Encounter: Payer: Self-pay | Admitting: Cardiology

## 2015-09-06 ENCOUNTER — Ambulatory Visit (HOSPITAL_COMMUNITY): Payer: Self-pay | Admitting: Psychiatry

## 2015-09-07 ENCOUNTER — Encounter: Payer: Self-pay | Admitting: Cardiology

## 2015-09-07 ENCOUNTER — Telehealth: Payer: Self-pay | Admitting: *Deleted

## 2015-09-07 NOTE — Telephone Encounter (Signed)
Spoke with DJ in billing about pts cath to be cancelled for this Friday 7/28.  Informed Neoma Laming that Dr Meda Coffee is out of the office this week and next week, but I will route this message to her for further orders in place of the pts cath.  Per Neoma Laming, she will call the pt to inform her exactly why her insurance denied her cath.  Informed Neoma Laming that I will call the cath lab to cancel her procedure scheduled for 7/28.  Neoma Laming verbalized understanding and agrees with this plan.  Will follow-up with the pt accordingly, once further recommendations are given.   Spoke with Nellie in the cath lab to cancel pts cath for Friday 7/28.

## 2015-09-07 NOTE — Telephone Encounter (Signed)
Follow-up       The pt is retuning Ivy's call  Please call the pt at work at [phone]

## 2015-09-07 NOTE — Telephone Encounter (Signed)
Spoke with the pt about her cath scheduled for 7/28 being denied by both of her insurance plans.  Pt agrees to cancel this and do different testing. Informed the pt that the next step is for Dr Meda Coffee to advise on what other kind of testing she would like to do in its place.  Informed the pt this would either be a stress test or stress echo.  Informed the pt that once Dr Meda Coffee advises on different testing, we will follow-up with her shortly thereafter.  Advised the pt that in the meantime, if she has any acute cardiac issues, she should defer to the ER for this. Pt verbalized understanding and agrees with this plan.

## 2015-09-07 NOTE — Telephone Encounter (Signed)
-----   Message from Elease Etienne sent at 09/07/2015  2:24 PM EDT ----- Regarding: CATH DENIED  PER INSURANCE  FOR  DATE 09/09/2015  Contact: [phone] GOOD AFTERNOON I AM WRITING BECAUSE  I AM  IS UNABLE TO GET THE CATH APPROVED . DUE TO THE GUIDELINES PATIENT DOES NOT HAVE CAD . SUGGESTION WAS A NUCLEAR STRESS TEST OR STRESS ECHO . OTHERWISE  IT WILL REQUIRE PEER TO PEER AND DR NELSON WOULD HAVE TO CALL . PT HAS TO INSURANCE [facility] DOES NOT REQUIRE PRECERT BUT HER SECONDARY INSURANCE [employer] DENIED THE PROCEDURE  PLEASE ADVISE   PEER TO PEER # [phone] CASE NUMBER [id number]

## 2015-09-08 ENCOUNTER — Encounter: Payer: Self-pay | Admitting: Internal Medicine

## 2015-09-08 ENCOUNTER — Telehealth: Payer: Self-pay | Admitting: Cardiology

## 2015-09-08 DIAGNOSIS — M329 Systemic lupus erythematosus, unspecified: Secondary | ICD-10-CM

## 2015-09-08 DIAGNOSIS — Z8249 Family history of ischemic heart disease and other diseases of the circulatory system: Secondary | ICD-10-CM

## 2015-09-08 DIAGNOSIS — E785 Hyperlipidemia, unspecified: Secondary | ICD-10-CM

## 2015-09-08 DIAGNOSIS — R945 Abnormal results of liver function studies: Secondary | ICD-10-CM

## 2015-09-08 DIAGNOSIS — R072 Precordial pain: Secondary | ICD-10-CM

## 2015-09-08 DIAGNOSIS — R7989 Other specified abnormal findings of blood chemistry: Secondary | ICD-10-CM

## 2015-09-08 NOTE — Telephone Encounter (Signed)
Pt calling to ask if Dr Meda Coffee advised on what type testing she suggest, in place of her denied cath.  Informed the pt that Dr Meda Coffee has not advised on this, but once she does, either myself or a triage nurse will follow-up with her shortly thereafter.  Pt verbalized understanding and agrees with this plan.

## 2015-09-08 NOTE — Telephone Encounter (Signed)
I SPOKE TO PATIENT SHE HAS FULL UNDERSTANDING OF HER CATH BEING CANCELLED BECAUSE IT  DID  NOT MEET MEDICAL CRITERIA . WILL DISCUSS  OTHER OPTIONS  LIKE NUCLEAR STRESS TEST OR STRESS ECHO  WITH  DR . PATIENT WAS ADVISED  IF SHE HAS CHEST PAINS OR ANY SYMPTOMS TO GO TO EMERGENCY ROOM OR CALL 911

## 2015-09-08 NOTE — Telephone Encounter (Signed)
Follow up    Pt verbalized that she is returning call for rn and she wants rn to know that she is aware that her procedure is canceled

## 2015-09-09 ENCOUNTER — Encounter (HOSPITAL_COMMUNITY): Admission: RE | Payer: Self-pay | Source: Ambulatory Visit

## 2015-09-09 ENCOUNTER — Ambulatory Visit (HOSPITAL_COMMUNITY): Admission: RE | Admit: 2015-09-09 | Payer: 59 | Source: Ambulatory Visit | Admitting: Interventional Cardiology

## 2015-09-09 SURGERY — LEFT HEART CATH AND CORONARY ANGIOGRAPHY
Anesthesia: LOCAL

## 2015-09-14 ENCOUNTER — Telehealth: Payer: Self-pay | Admitting: Internal Medicine

## 2015-09-14 MED ORDER — FLUTICASONE FUROATE-VILANTEROL 200-25 MCG/INH IN AEPB: 1.0000 | INHALATION_SPRAY | Freq: Every day | RESPIRATORY_TRACT | 12 refills | Status: AC

## 2015-09-14 MED FILL — BREO ELLIPTA 200-25 MCG INH: 200-25 | 30 days supply | Qty: 60 | Fill #0

## 2015-09-14 NOTE — Telephone Encounter (Signed)
Breo 200 sample worked well to stop persistent cough. Cough has returned and she asks Rx for Breo. P- Rx Breo 200 sent to [place]

## 2015-09-19 ENCOUNTER — Telehealth: Payer: Self-pay | Admitting: Cardiology

## 2015-09-19 NOTE — Telephone Encounter (Signed)
-----   Message from Dorothy Spark, MD sent at 09/19/2015 10:43 AM EDT ----- Please repeat her CMP in 2 weeks.

## 2015-09-19 NOTE — Telephone Encounter (Signed)
Call Documentation   Nuala Alpha, LPN at [phone] AM   Status: Signed    Left a message for the pt to call back to inform her that Dr Meda Coffee would like for her to have a lexiscan done, in place of denied cath.     Dorothy Spark, MD at 09/19/2015 9:17 AM   Status: Signed    Please order a nuclear stress test

## 2015-09-19 NOTE — Telephone Encounter (Signed)
Notified the pt that per Dr Meda Coffee, she reviewed her labs and recommends that we check a repeat cmet x 2 weeks for elevated LFTs.  Also informed the pt that per Dr Meda Coffee, she recommends that we do a lexiscan on her, to replace her denied cath.  Scheduled the pt to have a repeat cmet done for 8/21 here at our office.  Informed the pt that I will place the lexiscan order in the system, and have one of our [facility] schedulers call her back to have this arranged.  Went over [employer] with the pt over the phone.  Informed the pt that she may take all her meds with water or she can hold them.  Informed her that its not contraindicated with this test.  Pt verbalized understanding and agrees with this plan.

## 2015-09-19 NOTE — Telephone Encounter (Signed)
Left a message for the pt to call back to inform her that Dr Meda Coffee would like for her to have a lexiscan done, in place of denied cath.

## 2015-09-19 NOTE — Telephone Encounter (Signed)
Notified the pt that per Dr Meda Coffee, she reviewed her labs and recommends that we check a repeat cmet x 2 weeks for elevated LFTs.  Also informed the pt that per Dr Meda Coffee, she recommends that we do a lexiscan on her, to replace her denied cath.  Scheduled the pt to have a repeat cmet done for 8/21 here at our office.  Informed the pt that I will place the lexiscan order in the system, and have one of our [facility] schedulers call her back to have this arranged.  Went over [employer] with the pt over the phone.  Informed the pt that she may take all her meds with water or she can hold them. Informed her that its not contraindicated with this test.  Pt verbalized understanding and agrees with this plan.

## 2015-09-19 NOTE — Telephone Encounter (Signed)
New message    Pt verbalized that she wants rn to make appt for the stress test on any day but Thursday anytime that is available and just leave her a vm of day and time when it is scheduled.

## 2015-09-19 NOTE — Telephone Encounter (Signed)
Please order a nuclear stress test

## 2015-09-19 NOTE — Telephone Encounter (Signed)
Notified the pt that per Dr Meda Coffee, she reviewed her labs and recommends that we check a repeat cmet x 2 weeks for elevated LFTs.  Also informed the pt that per Dr Meda Coffee, she recommends that we do a lexiscan on her, to replace her denied cath.  Scheduled the pt to have a repeat cmet done for 8/21 here at our office.  Informed the pt that I will place the lexiscan order in the system, and have one of our [facility] schedulers call her back to have this arranged.  Went over [employer] with the pt over the phone.  Informed the pt that she may take all her meds with water or she can hold them.  Informed her that its not contraindicated with this test.  Pt verbalized understanding and agrees with this plan.

## 2015-09-19 NOTE — Telephone Encounter (Signed)
Pts Allison Davies is scheduled for 10/03/15 at 0945.  Pt made aware of appt date and time by [facility] scheduling.

## 2015-09-20 ENCOUNTER — Encounter: Payer: Self-pay | Admitting: Internal Medicine

## 2015-09-21 ENCOUNTER — Encounter: Payer: Self-pay | Admitting: Internal Medicine

## 2015-09-27 ENCOUNTER — Telehealth (HOSPITAL_COMMUNITY): Payer: Self-pay | Admitting: *Deleted

## 2015-09-27 DIAGNOSIS — G4733 Obstructive sleep apnea (adult) (pediatric): Secondary | ICD-10-CM | POA: Diagnosis not present

## 2015-09-27 NOTE — Telephone Encounter (Signed)
Patient given detailed instructions per Myocardial Perfusion Study Information Sheet for the test on 10/03/15 at 0945. Patient notified to arrive 15 minutes early and that it is imperative to arrive on time for appointment to keep from having the test rescheduled.  If you need to cancel or reschedule your appointment, please call the office within 24 hours of your appointment. Failure to do so may result in a cancellation of your appointment, and a $50 no show fee. Patient verbalized understanding.Mylei Brackeen, Ranae Palms

## 2015-10-03 ENCOUNTER — Ambulatory Visit (HOSPITAL_COMMUNITY): Payer: 59 | Attending: Cardiovascular Disease

## 2015-10-03 ENCOUNTER — Other Ambulatory Visit: Payer: 59 | Admitting: *Deleted

## 2015-10-03 DIAGNOSIS — R072 Precordial pain: Secondary | ICD-10-CM | POA: Insufficient documentation

## 2015-10-03 DIAGNOSIS — Z8249 Family history of ischemic heart disease and other diseases of the circulatory system: Secondary | ICD-10-CM | POA: Insufficient documentation

## 2015-10-03 DIAGNOSIS — Z87891 Personal history of nicotine dependence: Secondary | ICD-10-CM | POA: Diagnosis not present

## 2015-10-03 DIAGNOSIS — R7989 Other specified abnormal findings of blood chemistry: Secondary | ICD-10-CM | POA: Diagnosis not present

## 2015-10-03 DIAGNOSIS — E119 Type 2 diabetes mellitus without complications: Secondary | ICD-10-CM | POA: Insufficient documentation

## 2015-10-03 DIAGNOSIS — E785 Hyperlipidemia, unspecified: Secondary | ICD-10-CM | POA: Insufficient documentation

## 2015-10-03 DIAGNOSIS — R945 Abnormal results of liver function studies: Secondary | ICD-10-CM

## 2015-10-03 DIAGNOSIS — R9439 Abnormal result of other cardiovascular function study: Secondary | ICD-10-CM | POA: Insufficient documentation

## 2015-10-03 DIAGNOSIS — M329 Systemic lupus erythematosus, unspecified: Secondary | ICD-10-CM | POA: Insufficient documentation

## 2015-10-03 DIAGNOSIS — R0609 Other forms of dyspnea: Secondary | ICD-10-CM | POA: Insufficient documentation

## 2015-10-03 DIAGNOSIS — R42 Dizziness and giddiness: Secondary | ICD-10-CM | POA: Insufficient documentation

## 2015-10-03 DIAGNOSIS — R002 Palpitations: Secondary | ICD-10-CM | POA: Diagnosis not present

## 2015-10-03 DIAGNOSIS — R0602 Shortness of breath: Secondary | ICD-10-CM | POA: Diagnosis not present

## 2015-10-03 LAB — COMPREHENSIVE METABOLIC PANEL
ALT: 51 U/L — ABNORMAL HIGH (ref 6–29)
AST: 47 U/L — ABNORMAL HIGH (ref 10–35)
Albumin: 4.2 g/dL (ref 3.6–5.1)
Alkaline Phosphatase: 89 U/L (ref 33–115)
BUN: 13 mg/dL (ref 7–25)
CO2: 25 mmol/L (ref 20–31)
Calcium: 9 mg/dL (ref 8.6–10.2)
Chloride: 105 mmol/L (ref 98–110)
Creat: 0.84 mg/dL (ref 0.50–1.10)
Glucose, Bld: 150 mg/dL — ABNORMAL HIGH (ref 65–99)
Potassium: 4.1 mmol/L (ref 3.5–5.3)
Sodium: 138 mmol/L (ref 135–146)
Total Bilirubin: 0.5 mg/dL (ref 0.2–1.2)
Total Protein: 6.8 g/dL (ref 6.1–8.1)

## 2015-10-03 LAB — MYOCARDIAL PERFUSION IMAGING
LV dias vol: 90 mL (ref 46–106)
LV sys vol: 36 mL
Peak HR: 116 {beats}/min
RATE: 0.36
Rest HR: 67 {beats}/min
SDS: 5
SRS: 6
SSS: 11
TID: 0.91

## 2015-10-03 MED ORDER — AMINOPHYLLINE 25 MG/ML IV SOLN
75.0000 mg | Freq: Once | INTRAVENOUS | Status: AC
  Administered 2015-10-03: 75 mg via INTRAVENOUS

## 2015-10-03 MED ORDER — AMINOPHYLLINE 25 MG/ML IV SOLN: 11635.0000 mg | Freq: Once | INTRAVENOUS | Status: AC

## 2015-10-03 MED ORDER — REGADENOSON 0.4 MG/5ML IV SOLN
0.4000 mg | Freq: Once | INTRAVENOUS | Status: AC
  Administered 2015-10-03: 0.4 mg via INTRAVENOUS

## 2015-10-03 MED ORDER — TECHNETIUM TC 99M TETROFOSMIN IV KIT
32.7000 | PACK | Freq: Once | INTRAVENOUS | Status: AC | PRN
  Administered 2015-10-03: 32.7 via INTRAVENOUS
  Filled 2015-10-03: qty 33

## 2015-10-03 MED ORDER — TECHNETIUM TC 99M TETROFOSMIN IV KIT
10.7000 | PACK | Freq: Once | INTRAVENOUS | Status: AC | PRN
  Administered 2015-10-03: 11 via INTRAVENOUS
  Filled 2015-10-03: qty 11

## 2015-10-04 ENCOUNTER — Telehealth: Payer: Self-pay | Admitting: Cardiology

## 2015-10-04 DIAGNOSIS — R9439 Abnormal result of other cardiovascular function study: Secondary | ICD-10-CM

## 2015-10-04 DIAGNOSIS — Z01818 Encounter for other preprocedural examination: Secondary | ICD-10-CM

## 2015-10-04 NOTE — Telephone Encounter (Signed)
New message       Pt stated that she is  returning dr call concerning her stress test result. Please call.

## 2015-10-04 NOTE — Telephone Encounter (Addendum)
Spoke with pt, aware of nuclear results. She has agreed to cath. LC&P scheduled 10/07/15 @ 7:30 am With dr Irish Lack. She will come by the office tomorrow morning to get labs drawn. Instructions discussed in detail with the patient. Patient voiced understanding.

## 2015-10-04 NOTE — Telephone Encounter (Signed)
Left message for pt to call.

## 2015-10-04 NOTE — Addendum Note (Signed)
Addended by: Dorothy Spark on: 10/04/2015 10:07 AM   Modules accepted: Orders, SmartSet

## 2015-10-05 ENCOUNTER — Other Ambulatory Visit: Payer: 59 | Admitting: *Deleted

## 2015-10-05 DIAGNOSIS — R9439 Abnormal result of other cardiovascular function study: Secondary | ICD-10-CM

## 2015-10-05 DIAGNOSIS — Z01818 Encounter for other preprocedural examination: Secondary | ICD-10-CM | POA: Diagnosis not present

## 2015-10-05 LAB — CBC
HCT: 43.3 % (ref 35.0–45.0)
Hemoglobin: 14.5 g/dL (ref 11.7–15.5)
MCH: 30.9 pg (ref 27.0–33.0)
MCHC: 33.5 g/dL (ref 32.0–36.0)
MCV: 92.3 fL (ref 80.0–100.0)
MPV: 12.4 fL (ref 7.5–12.5)
Platelets: 221 10*3/uL (ref 140–400)
RBC: 4.69 MIL/uL (ref 3.80–5.10)
RDW: 13.5 % (ref 11.0–15.0)
WBC: 8.3 10*3/uL (ref 3.8–10.8)

## 2015-10-05 LAB — PROTIME-INR
INR: 1
Prothrombin Time: 10.8 s (ref 9.0–11.5)

## 2015-10-07 ENCOUNTER — Encounter (HOSPITAL_COMMUNITY): Payer: Self-pay | Admitting: Interventional Cardiology

## 2015-10-07 ENCOUNTER — Ambulatory Visit (HOSPITAL_COMMUNITY)
Admission: RE | Admit: 2015-10-07 | Discharge: 2015-10-07 | Disposition: A | Discharge status: Home / Self Care | Payer: 59 | Source: Ambulatory Visit | Attending: Interventional Cardiology | Admitting: Interventional Cardiology

## 2015-10-07 ENCOUNTER — Encounter (HOSPITAL_COMMUNITY)
Admission: RE | Disposition: A | Discharge status: Home / Self Care | Payer: Self-pay | Source: Ambulatory Visit | Attending: Interventional Cardiology

## 2015-10-07 ENCOUNTER — Telehealth: Payer: Self-pay | Admitting: *Deleted

## 2015-10-07 ENCOUNTER — Other Ambulatory Visit: Payer: Self-pay

## 2015-10-07 DIAGNOSIS — K449 Diaphragmatic hernia without obstruction or gangrene: Secondary | ICD-10-CM | POA: Insufficient documentation

## 2015-10-07 DIAGNOSIS — K219 Gastro-esophageal reflux disease without esophagitis: Secondary | ICD-10-CM | POA: Insufficient documentation

## 2015-10-07 DIAGNOSIS — F329 Major depressive disorder, single episode, unspecified: Secondary | ICD-10-CM | POA: Diagnosis not present

## 2015-10-07 DIAGNOSIS — J45909 Unspecified asthma, uncomplicated: Secondary | ICD-10-CM | POA: Diagnosis not present

## 2015-10-07 DIAGNOSIS — H40059 Ocular hypertension, unspecified eye: Secondary | ICD-10-CM | POA: Diagnosis not present

## 2015-10-07 DIAGNOSIS — R931 Abnormal findings on diagnostic imaging of heart and coronary circulation: Secondary | ICD-10-CM

## 2015-10-07 DIAGNOSIS — Z8249 Family history of ischemic heart disease and other diseases of the circulatory system: Secondary | ICD-10-CM | POA: Diagnosis not present

## 2015-10-07 DIAGNOSIS — F419 Anxiety disorder, unspecified: Secondary | ICD-10-CM | POA: Diagnosis not present

## 2015-10-07 DIAGNOSIS — R9439 Abnormal result of other cardiovascular function study: Secondary | ICD-10-CM | POA: Diagnosis not present

## 2015-10-07 DIAGNOSIS — M3500 Sicca syndrome, unspecified: Secondary | ICD-10-CM | POA: Diagnosis not present

## 2015-10-07 DIAGNOSIS — M329 Systemic lupus erythematosus, unspecified: Secondary | ICD-10-CM | POA: Diagnosis not present

## 2015-10-07 DIAGNOSIS — M722 Plantar fascial fibromatosis: Secondary | ICD-10-CM | POA: Diagnosis not present

## 2015-10-07 DIAGNOSIS — Z87891 Personal history of nicotine dependence: Secondary | ICD-10-CM | POA: Insufficient documentation

## 2015-10-07 DIAGNOSIS — R945 Abnormal results of liver function studies: Secondary | ICD-10-CM

## 2015-10-07 DIAGNOSIS — G4730 Sleep apnea, unspecified: Secondary | ICD-10-CM | POA: Diagnosis not present

## 2015-10-07 DIAGNOSIS — K760 Fatty (change of) liver, not elsewhere classified: Secondary | ICD-10-CM | POA: Diagnosis not present

## 2015-10-07 DIAGNOSIS — M797 Fibromyalgia: Secondary | ICD-10-CM | POA: Insufficient documentation

## 2015-10-07 DIAGNOSIS — R7989 Other specified abnormal findings of blood chemistry: Secondary | ICD-10-CM

## 2015-10-07 DIAGNOSIS — E785 Hyperlipidemia, unspecified: Secondary | ICD-10-CM

## 2015-10-07 DIAGNOSIS — Z833 Family history of diabetes mellitus: Secondary | ICD-10-CM | POA: Insufficient documentation

## 2015-10-07 HISTORY — PX: CARDIAC CATHETERIZATION: SHX172

## 2015-10-07 LAB — GLUCOSE, CAPILLARY: GLUCOSE-CAPILLARY: 193 mg/dL — AB (ref 65–99)

## 2015-10-07 SURGERY — LEFT HEART CATH AND CORONARY ANGIOGRAPHY
Anesthesia: LOCAL

## 2015-10-07 MED ORDER — SODIUM CHLORIDE 0.9 % WEIGHT BASED INFUSION
3.0000 mL/kg/h | INTRAVENOUS | Status: DC
  Administered 2015-10-07: 3 mL/kg/h via INTRAVENOUS

## 2015-10-07 MED ORDER — HEPARIN SODIUM (PORCINE) 1000 UNIT/ML IJ SOLN
INTRAMUSCULAR | Status: AC
  Filled 2015-10-07: qty 1

## 2015-10-07 MED ORDER — NITROGLYCERIN 1 MG/10 ML FOR IR/CATH LAB
INTRA_ARTERIAL | Status: AC
  Filled 2015-10-07: qty 10

## 2015-10-07 MED ORDER — MIDAZOLAM HCL 2 MG/2ML IJ SOLN
INTRAMUSCULAR | Status: DC | PRN
  Administered 2015-10-07: 1 mg via INTRAVENOUS
  Administered 2015-10-07: 2 mg via INTRAVENOUS

## 2015-10-07 MED ORDER — SODIUM CHLORIDE 0.9 % IV SOLN: 250.0000 mL | INTRAVENOUS | Status: DC | PRN

## 2015-10-07 MED ORDER — SODIUM CHLORIDE 0.9% FLUSH: 3.0000 mL | INTRAVENOUS | Status: DC | PRN

## 2015-10-07 MED ORDER — PRAVASTATIN SODIUM 20 MG PO TABS: 10.0000 mg | ORAL_TABLET | Freq: Every evening | ORAL | 3 refills | Status: DC

## 2015-10-07 MED ORDER — IOPAMIDOL (ISOVUE-370) INJECTION 76%
INTRAVENOUS | Status: AC
  Filled 2015-10-07: qty 100

## 2015-10-07 MED ORDER — HEPARIN (PORCINE) IN NACL 2-0.9 UNIT/ML-% IJ SOLN
INTRAMUSCULAR | Status: DC | PRN
  Administered 2015-10-07: 1500 mL

## 2015-10-07 MED ORDER — LIDOCAINE HCL (PF) 1 % IJ SOLN
INTRAMUSCULAR | Status: AC
  Filled 2015-10-07: qty 30

## 2015-10-07 MED ORDER — VERAPAMIL HCL 2.5 MG/ML IV SOLN
INTRAVENOUS | Status: AC
  Filled 2015-10-07: qty 2

## 2015-10-07 MED ORDER — SODIUM CHLORIDE 0.9% FLUSH: 3.0000 mL | Freq: Two times a day (BID) | INTRAVENOUS | Status: DC

## 2015-10-07 MED ORDER — ASPIRIN 81 MG PO CHEW: 81.0000 mg | CHEWABLE_TABLET | ORAL | Status: DC

## 2015-10-07 MED ORDER — SODIUM CHLORIDE 0.9 % WEIGHT BASED INFUSION: 1.0000 mL/kg/h | INTRAVENOUS | Status: DC

## 2015-10-07 MED ORDER — IOPAMIDOL (ISOVUE-370) INJECTION 76%
INTRAVENOUS | Status: DC | PRN
  Administered 2015-10-07: 45 mL via INTRA_ARTERIAL

## 2015-10-07 MED ORDER — MIDAZOLAM HCL 2 MG/2ML IJ SOLN
INTRAMUSCULAR | Status: AC
  Filled 2015-10-07: qty 2

## 2015-10-07 MED ORDER — FENTANYL CITRATE (PF) 100 MCG/2ML IJ SOLN
INTRAMUSCULAR | Status: AC
  Filled 2015-10-07: qty 2

## 2015-10-07 MED ORDER — LIDOCAINE HCL (PF) 1 % IJ SOLN
INTRAMUSCULAR | Status: DC | PRN
  Administered 2015-10-07: 2 mL via SUBCUTANEOUS

## 2015-10-07 MED ORDER — FENTANYL CITRATE (PF) 100 MCG/2ML IJ SOLN
INTRAMUSCULAR | Status: DC | PRN
  Administered 2015-10-07 (×2): 25 ug via INTRAVENOUS

## 2015-10-07 MED ORDER — VERAPAMIL HCL 2.5 MG/ML IV SOLN
INTRAVENOUS | Status: DC | PRN
  Administered 2015-10-07: 10 mL via INTRA_ARTERIAL

## 2015-10-07 MED ORDER — NITROGLYCERIN 1 MG/10 ML FOR IR/CATH LAB
INTRA_ARTERIAL | Status: DC | PRN
  Administered 2015-10-07 (×2): 200 ug via INTRA_ARTERIAL

## 2015-10-07 MED ORDER — HEPARIN (PORCINE) IN NACL 2-0.9 UNIT/ML-% IJ SOLN
INTRAMUSCULAR | Status: AC
  Filled 2015-10-07: qty 1500

## 2015-10-07 MED ORDER — HEPARIN SODIUM (PORCINE) 1000 UNIT/ML IJ SOLN
INTRAMUSCULAR | Status: DC | PRN
  Administered 2015-10-07: 4500 [IU] via INTRAVENOUS

## 2015-10-07 SURGICAL SUPPLY — 8 items
CATH IMPULSE 5F ANG/FL3.5 (CATHETERS) ×1 IMPLANT
DEVICE RAD COMP TR BAND LRG (VASCULAR PRODUCTS) ×1 IMPLANT
GLIDESHEATH SLEND SS 6F .021 (SHEATH) ×2 IMPLANT
KIT HEART LEFT (KITS) ×2 IMPLANT
PACK CARDIAC CATHETERIZATION (CUSTOM PROCEDURE TRAY) ×2 IMPLANT
TRANSDUCER W/STOPCOCK (MISCELLANEOUS) ×2 IMPLANT
TUBING CIL FLEX 10 FLL-RA (TUBING) ×2 IMPLANT
WIRE SAFE-T 1.5MM-J .035X260CM (WIRE) ×1 IMPLANT

## 2015-10-07 NOTE — Telephone Encounter (Signed)
Spoke with the pt and informed her that per Dr Meda Coffee, she recommends that we start her on Pravastatin 10 mg po daily, and check lipids and a cmet in 6 weeks.  Confirmed the pharmacy of choice with the pt.  Scheduled the pt a lab appt for Friday 11/18/15 at our office to check a cmet and lipids.  Pt is aware to come fasting to this lab appt.  Pt verbalized understanding and agrees with this plan.

## 2015-10-07 NOTE — Telephone Encounter (Signed)
-----   Message from Dorothy Spark, MD sent at 10/07/2015 11:55 AM EDT -----   ----- Message ----- From: Jettie Booze, MD Sent: 10/07/2015   9:33 AM To: Dorothy Spark, MD, Binnie Rail, MD

## 2015-10-07 NOTE — H&P (Signed)
CARDIOLOGY CONSULT NOTE      Patient ID: Allison Davies MRN: [id number] DOB/AGE: 05-21-1970 45 y.o.  Admit date: 10/07/2015 Referring PhysicianJayadeep Hassell Done, MD Primary [place], MD Primary CardiologistDr. Meda Coffee Reason for Consultationabnormal stress  HPI:45 y/o scheduled for cath due to abnormal stress test  Review of systems complete and found to be negative unless listed above   Past Medical History:  Diagnosis Date  . Anxiety    no meds, history of  . Asthma    rarely uses inhaler  . Blood glucose abnormal   . Bursitis of both hips 12/28/14  . Connective tissue disease ([place])    Lupus  . Depression     no meds, history of  . Fatty liver   . Fibromyalgia 12/28/14  . GERD (gastroesophageal reflux disease)   . Hiatal hernia   . Joint pain   . Lupus ([place])   . Morphea   . Muscle spasm of back   . Ocular hypertension   . Plantar fascia syndrome   . Sjogren's syndrome ([place])   . Sleep apnea     Family History  Problem Relation Age of Onset  . Colon cancer Neg Hx   . Suicidality Neg Hx   . Diabetes Mother   . Heart disease Mother   . Depression Mother   . Diabetes Father   . Depression Father   . Diabetes Maternal Grandfather   . Heart disease Maternal Grandfather   . Diabetes Maternal Grandmother   . Heart disease Maternal Grandmother   . Heart disease Brother   . Hypertension Brother   . Stroke Brother     Social History   Social History  . Marital status: Married    Spouse name: N/A  . Number of children: 3  . Years of education: 14   Occupational History  . [employer]   Social History Main Topics  . Smoking status: Former Smoker    Packs/day: 2.00    Years: 20.00    Types: Cigarettes    Quit date: 03/01/2003  . Smokeless tobacco: Never Used  . Alcohol use No  . Drug use: No  . Sexual activity: Yes    Birth control/ protection: Surgical   Other Topics Concern  . Not on file   Social History  Narrative   Lives at home with spouse   Caffeine use- 1 diet Dr Malachi Bonds daily, 8 oz    Past Surgical History:  Procedure Laterality Date  . ABDOMINAL HYSTERECTOMY  2014  . APPENDECTOMY  1994  . HYSTEROSCOPY W/D&C N/A 10/16/2012   Procedure: DILATATION AND CURETTAGE /HYSTEROSCOPY;  Surgeon: Marylynn Pearson, MD;  Location: [facility];  Service: Gynecology;  Laterality: N/A;  . LAPAROSCOPIC ASSISTED VAGINAL HYSTERECTOMY N/A 04/28/2013   Procedure: LAPAROSCOPIC ASSISTED VAGINAL HYSTERECTOMY;  Surgeon: Thurnell Lose, MD;  Location: [facility];  Service: Gynecology;  Laterality: N/A;  . SALPINGECTOMY     bi-lateral  . VULVA Milagros Loll BIOPSY N/A 04/28/2013   Procedure: VULVAR BIOPSY;  Surgeon: Thurnell Lose, MD;  Location: [facility];  Service: Gynecology;  Laterality: N/A;  . WISDOM TOOTH EXTRACTION       Prescriptions Prior to Admission  Medication Sig Dispense Refill Last Dose  . albuterol (PROVENTIL HFA;VENTOLIN HFA) 108 (90 Base) MCG/ACT inhaler Inhale 2 puffs into the lungs every 6 (six) hours as needed for wheezing or shortness of breath. 1 Inhaler 0 Past Week at Unknown time  . Cholecalciferol (VITAMIN D3) 2000  units capsule Take 2,000 Units by mouth daily.   10/06/2015 at Unknown time  . Cinnamon 500 MG TABS Take 1 tablet (500 mg total) by mouth daily.   10/06/2015 at Unknown time  . FLUoxetine (PROZAC) 20 MG capsule Take 1 capsule (20 mg total) by mouth daily. 90 capsule 1 10/06/2015 at Unknown time  . fluticasone furoate-vilanterol (BREO ELLIPTA) 200-25 MCG/INH AEPB Inhale 1 puff into the lungs daily. Rinse mouth well 60 each 12 10/07/2015 at Unknown time  . hydroxychloroquine (PLAQUENIL) 200 MG tablet Take 0.5 tablets (100 mg total) by mouth daily.   10/06/2015 at Unknown time  . Multiple Vitamins-Minerals (MULTIVITAMIN GUMMIES ADULT) CHEW Chew 1 tablet by mouth daily. Fiber well with B vitamins   10/06/2015 at Unknown time  . OVER THE COUNTER MEDICATION Apply 1 application topically daily. Neutrogena   Ultra Sheer sunblock , uses everyday due to her Lupus   10/06/2015 at Unknown time  . pantoprazole (PROTONIX) 40 MG tablet Take 1 tablet (40 mg total) by mouth 2 (two) times daily. 90 tablet 0 10/06/2015 at Unknown time  . Polyvinyl Alcohol-Povidone (REFRESH OP) Place 1 drop into both eyes as needed (for dry eyes).   10/06/2015 at Unknown time  . Alcohol Swabs (ALCOHOL PADS) 70 % PADS Use to clean site to check blood sugars twice a day Dx E11.9 (Patient not taking: Reported on 10/04/2015) 100 each 3 Not Taking at Unknown time  . glucose blood (ONE TOUCH ULTRA TEST) test strip 1 each by Other route 2 (two) times daily. Use to check blood sugars twice a day  Dx E11.9 (Patient not taking: Reported on 10/04/2015) 100 each 3 Not Taking at Unknown time  . Lancets (ONETOUCH ULTRASOFT) lancets 1 each by Other route 2 (two) times daily. Use to help check blood sugars twice a day Dx E11.9 (Patient not taking: Reported on 10/04/2015) 100 each 3 Not Taking at Unknown time    Physical Exam: Vitals:   Vitals:   10/07/15 0532  BP: 123/85  Pulse: 75  Resp: 18  Temp: 98.1 F (36.7 C)  TempSrc: Oral  SpO2: 98%  Weight: 194 lb (88 kg)  Height: 5\' 6"  (1.676 m)   I&O's:  No intake or output data in the 24 hours ending 10/07/15 0824 Physical exam:  [place]/AT EOMI No JVD, No carotid bruit RRR S1S2  No wheezing Soft. NT, nondistended No edema. No focal motor or sensory deficits Normal affect  Labs:   Lab Results  Component Value Date   WBC 8.3 10/05/2015   HGB 14.5 10/05/2015   HCT 43.3 10/05/2015   MCV 92.3 10/05/2015   PLT 221 10/05/2015    Recent Labs Lab 10/03/15 0911  NA 138  K 4.1  CL 105  CO2 25  BUN 13  CREATININE 0.84  CALCIUM 9.0  PROT 6.8  BILITOT 0.5  ALKPHOS 89  ALT 51*  AST 47*  GLUCOSE 150*   No results found for: CKTOTAL, CKMB, CKMBINDEX, TROPONINI Lab Results  Component Value Date   CHOL 182 09/05/2015   Lab Results  Component Value Date   HDL 33 (L) 09/05/2015    Lab Results  Component Value Date   LDLCALC 118 09/05/2015   Lab Results  Component Value Date   TRIG 155 (H) 09/05/2015   Lab Results  Component Value Date   CHOLHDL 5.5 (H) 09/05/2015   No results found for: LDLDIRECT    Radiology: EKG:  ASSESSMENT AND PLAN:  Active Problems:   *  No active hospital problems. *  Abnormal stress test.  Plan for cathCath Lab Visit (complete for each Cath Lab visit)  Clinical Evaluation Leading to the Procedure:   ACS: No.  Non-ACS:    Anginal Classification: CCS III  Anti-ischemic medical therapy: Minimal Therapy (1 class of medications)  Non-Invasive Test Results: Intermediate-risk stress test findings: cardiac mortality 1-3%/year  Prior CABG: No previous CABG       Signed:   Mina Marble, MD, [facility] 10/07/2015, 8:24 AM

## 2015-10-07 NOTE — Progress Notes (Signed)
She has normal coronaries, however her lipids are elevated therefore I would give her a low-dose pravastatin 10 mg daily and recheck her LFTs and lipids in 2 months.

## 2015-10-07 NOTE — Discharge Instructions (Signed)
Radial Site Care °Refer to this sheet in the next few weeks. These instructions provide you with information about caring for yourself after your procedure. Your health care provider may also give you more specific instructions. Your treatment has been planned according to current medical practices, but problems sometimes occur. Call your health care provider if you have any problems or questions after your procedure. °WHAT TO EXPECT AFTER THE PROCEDURE °After your procedure, it is typical to have the following: °· Bruising at the radial site that usually fades within 1-2 weeks. °· Blood collecting in the tissue (hematoma) that may be painful to the touch. It should usually decrease in size and tenderness within 1-2 weeks. °HOME CARE INSTRUCTIONS °· Take medicines only as directed by your health care provider. °· You may shower 24-48 hours after the procedure or as directed by your health care provider. Remove the bandage (dressing) and gently wash the site with plain soap and water. Pat the area dry with a clean towel. Do not rub the site, because this may cause bleeding. °· Do not take baths, swim, or use a hot tub until your health care provider approves. °· Check your insertion site every day for redness, swelling, or drainage. °· Do not apply powder or lotion to the site. °· Do not flex or bend the affected arm for 24 hours or as directed by your health care provider. °· Do not push or pull heavy objects with the affected arm for 24 hours or as directed by your health care provider. °· Do not lift over 10 lb (4.5 kg) for 5 days after your procedure or as directed by your health care provider. °· Ask your health care provider when it is okay to: °¨ Return to work or school. °¨ Resume usual physical activities or sports. °¨ Resume sexual activity. °· Do not drive home if you are discharged the same day as the procedure. Have someone else drive you. °· You may drive 24 hours after the procedure unless otherwise  instructed by your health care provider. °· Do not operate machinery or power tools for 24 hours after the procedure. °· If your procedure was done as an outpatient procedure, which means that you went home the same day as your procedure, a responsible adult should be with you for the first 24 hours after you arrive home. °· Keep all follow-up visits as directed by your health care provider. This is important. °SEEK MEDICAL CARE IF: °· You have a fever. °· You have chills. °· You have increased bleeding from the radial site. Hold pressure on the site. °SEEK IMMEDIATE MEDICAL CARE IF: °· You have unusual pain at the radial site. °· You have redness, warmth, or swelling at the radial site. °· You have drainage (other than a small amount of blood on the dressing) from the radial site. °· The radial site is bleeding, and the bleeding does not stop after 30 minutes of holding steady pressure on the site. °· Your arm or hand becomes pale, cool, tingly, or numb. °  °This information is not intended to replace advice given to you by your health care provider. Make sure you discuss any questions you have with your health care provider. °  °Document Released: 03/03/2010 Document Revised: 02/19/2014 Document Reviewed: 08/17/2013 °Elsevier Interactive Patient Education ©2016 [employer] ° °

## 2015-10-07 NOTE — Telephone Encounter (Signed)
Dr Meda Coffee received  this pts cath report was routed to her from Dr Irish Lack, who performed her cath today.  After Dr Meda Coffee reviewed this report she provided telephone orders to myself to call the pt and start her on pravastatin 10 mg po daily and repeat a cmet and lipids in 6 weeks. Pt just had cath today, so will make contact with her to endorse these orders once she is at home.

## 2015-10-21 MED FILL — BREO ELLIPTA 200-25 MCG INH: 200-25 | 30 days supply | Qty: 60 | Fill #1

## 2015-10-28 DIAGNOSIS — Z79899 Other long term (current) drug therapy: Secondary | ICD-10-CM | POA: Diagnosis not present

## 2015-10-28 DIAGNOSIS — H40053 Ocular hypertension, bilateral: Secondary | ICD-10-CM | POA: Diagnosis not present

## 2015-10-28 DIAGNOSIS — E119 Type 2 diabetes mellitus without complications: Secondary | ICD-10-CM | POA: Diagnosis not present

## 2015-10-28 DIAGNOSIS — Z09 Encounter for follow-up examination after completed treatment for conditions other than malignant neoplasm: Secondary | ICD-10-CM | POA: Diagnosis not present

## 2015-10-28 DIAGNOSIS — H40013 Open angle with borderline findings, low risk, bilateral: Secondary | ICD-10-CM | POA: Diagnosis not present

## 2015-10-28 DIAGNOSIS — M328 Other forms of systemic lupus erythematosus: Secondary | ICD-10-CM | POA: Diagnosis not present

## 2015-10-28 DIAGNOSIS — H04123 Dry eye syndrome of bilateral lacrimal glands: Secondary | ICD-10-CM | POA: Diagnosis not present

## 2015-10-28 DIAGNOSIS — Z049 Encounter for examination and observation for unspecified reason: Secondary | ICD-10-CM | POA: Diagnosis not present

## 2015-10-28 LAB — HM DIABETES EYE EXAM

## 2015-10-31 ENCOUNTER — Encounter: Payer: Self-pay | Admitting: Internal Medicine

## 2015-11-03 ENCOUNTER — Ambulatory Visit (INDEPENDENT_AMBULATORY_CARE_PROVIDER_SITE_OTHER): Payer: 59 | Admitting: Family Medicine

## 2015-11-03 ENCOUNTER — Encounter: Payer: Self-pay | Admitting: Cardiology

## 2015-11-03 VITALS — BP 121/60 | HR 89 | Temp 98.8°F | Ht 66.0 in | Wt 195.8 lb

## 2015-11-03 DIAGNOSIS — H6982 Other specified disorders of Eustachian tube, left ear: Secondary | ICD-10-CM

## 2015-11-03 DIAGNOSIS — H578 Other specified disorders of eye and adnexa: Secondary | ICD-10-CM

## 2015-11-03 DIAGNOSIS — H5789 Other specified disorders of eye and adnexa: Secondary | ICD-10-CM

## 2015-11-03 DIAGNOSIS — H6592 Unspecified nonsuppurative otitis media, left ear: Secondary | ICD-10-CM | POA: Diagnosis not present

## 2015-11-03 MED FILL — HYDROXYCHLOROQUINE 200 MG T: 200 | 90 days supply | Qty: 45 | Fill #0

## 2015-11-03 NOTE — Progress Notes (Signed)
[facility] [address], [place], [place] [address] [id number] [phone]  Date:  11/03/2015   Name:  Allison Davies   DOB:  Sep 08, 1970   MRN:  [id number]  PCP:  Binnie Rail, MD    Chief Complaint: Otitis Media (c/o poss ear infection left ear x 3 day. Pt will be flying tomorow.  c/o left eye itching, burning x 2 week. )   History of Present Illness:  Allison Davies is a 45 y.o. 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  Her PCP is not around today and she was worried about a left ear problem- she is concerned because she is flying to [place] tomorrow and is worried about altitude change She notes pressure and pain, popping in her left ear.  The right ear is ok.  No tooth pain, no fever No ST or cough.   Also notes that her left eye (only left) has been red and itchy for about 3 weeks- this does seem to be getting better over the last few days however She was dx with lupus about 4 years ago, also Sjogrens, scleroderma She did have a negative cardiac cath a couple of years ago- done due to lupus  Lab Results  Component Value Date   HGBA1C 8.4 (H) 08/19/2015   She sees Dr. Amil Amen for rheumatology care Dr. Quay Burow is monitoring on her A1c.  She is trying to exercise and lose weight- she has lost 15 lbs! So far they have not put her on medication for DM  She just saw her opthalmologist on Friday- they tested her pressure and she does not have glaucoma or elevated eye pressure at this time.  Her eye doctor did not appreciate any other significant eye problem at this time  She is enjoying her current job, and her oldest daughter now has 2 children.she is really enjoying being a grandmother   Patient Active Problem List   Diagnosis Date Noted  . Abnormal nuclear stress test   . SLE (systemic lupus erythematosus) ([place]) 09/02/2015  . FH: CAD (coronary artery disease)  09/02/2015  . HLD (hyperlipidemia) 09/02/2015  . Diabetes mellitus with no complication ([place]) [id number]  . Elevated LFTs 09/02/2015  . Obese 08/20/2015  . Pain in the chest 08/20/2015  . Borderline glaucoma 08/20/2015  . Diabetes ([place]) 08/20/2015  . Sjoegren syndrome ([place]) 08/19/2015  . Fibromyalgia 08/19/2015  . Asthma 08/19/2015  . GERD (gastroesophageal reflux disease) 08/19/2015  . Vitamin D deficiency 08/19/2015  . B12 deficiency 08/19/2015  . Obstructive sleep apnea 11/19/2014  . Systemic lupus erythematosus ([place]) 11/19/2014  . H/O oral aphthous ulcers 11/19/2014  . Dizziness and giddiness 01/08/2012  . Hematemesis 12/10/2011  . Edema 11/27/2011  . Cephalalgia 11/27/2011  . LBP (low back pain) 11/27/2011  . Fatty infiltration of liver 03/28/2011  . Anxiety 10/04/2009    Past Medical History:  Diagnosis Date  . Anxiety    no meds, history of  . Asthma    rarely uses inhaler  . Blood glucose abnormal   . Bursitis of both hips 12/28/14  . Connective tissue disease ([place])    Lupus  . Depression     no meds, history of  . Fatty liver   . Fibromyalgia 12/28/14  . GERD (gastroesophageal reflux disease)   . Hiatal hernia   . Joint pain   . Lupus (  [place])   . Morphea   . Muscle spasm of back   . Ocular hypertension   . Plantar fascia syndrome   . Sjogren's syndrome ([place])   . Sleep apnea     Past Surgical History:  Procedure Laterality Date  . ABDOMINAL HYSTERECTOMY  2014  . APPENDECTOMY  1994  . CARDIAC CATHETERIZATION N/A 10/07/2015   Procedure: Left Heart Cath and Coronary Angiography;  Surgeon: Jettie Booze, MD;  Location: [facility];  Service: Cardiovascular;  Laterality: N/A;  . HYSTEROSCOPY W/D&C N/A 10/16/2012   Procedure: DILATATION AND CURETTAGE /HYSTEROSCOPY;  Surgeon: Marylynn Pearson, MD;  Location: [facility];  Service: Gynecology;  Laterality: N/A;  . LAPAROSCOPIC ASSISTED VAGINAL HYSTERECTOMY N/A 04/28/2013   Procedure: LAPAROSCOPIC ASSISTED  VAGINAL HYSTERECTOMY;  Surgeon: Thurnell Lose, MD;  Location: [facility];  Service: Gynecology;  Laterality: N/A;  . SALPINGECTOMY     bi-lateral  . VULVA Milagros Loll BIOPSY N/A 04/28/2013   Procedure: VULVAR BIOPSY;  Surgeon: Thurnell Lose, MD;  Location: [facility];  Service: Gynecology;  Laterality: N/A;  . WISDOM TOOTH EXTRACTION      Social History  Substance Use Topics  . Smoking status: Former Smoker    Packs/day: 2.00    Years: 20.00    Types: Cigarettes    Quit date: 03/01/2003  . Smokeless tobacco: Never Used  . Alcohol use No    Family History  Problem Relation Age of Onset  . Diabetes Mother   . Heart disease Mother   . Depression Mother   . Diabetes Father   . Depression Father   . Diabetes Maternal Grandfather   . Heart disease Maternal Grandfather   . Diabetes Maternal Grandmother   . Heart disease Maternal Grandmother   . Heart disease Brother   . Hypertension Brother   . Stroke Brother   . Colon cancer Neg Hx   . Suicidality Neg Hx     Allergies  Allergen Reactions  . Lemon Extract [Flavoring Agent] Anaphylaxis  . Prochlorperazine Shortness Of Breath  . Prochlorperazine Edisylate Anaphylaxis and Other (See Comments)    Compazine  . Codeine Nausea And Vomiting and Other (See Comments)    severe  . Methotrexate Derivatives Nausea Only and Other (See Comments)    Dizzy  . Tramadol Other (See Comments)    Severe headache  . Ativan [Lorazepam] Palpitations  . Hydrocodone Palpitations and Other (See Comments)    Patient states that she can not take narcotics. Tylenol is the only pain medication she can take.  . Isometheptene-Dichloral-Apap Palpitations and Other (See Comments)    Darvocet  . Klonopin [Clonazepam] Palpitations  . Moxifloxacin Rash  . Penicillins Hives and Rash    Has patient had a PCN reaction causing immediate rash, facial/tongue/throat swelling, SOB or lightheadedness with hypotension: Yes Has patient had a PCN reaction causing severe rash  involving mucus membranes or skin necrosis: No Has patient had a PCN reaction that required hospitalization No Has patient had a PCN reaction occurring within the last 10 years: No If all of the above answers are "NO", then may proceed with Cephalosporin use.   . Plaquenil [Hydroxychloroquine] Palpitations  . Promethazine Swelling  . Xanax [Alprazolam] Palpitations    Medication list has been reviewed and updated.  Current Outpatient Prescriptions on File Prior to Visit  Medication Sig Dispense Refill  . albuterol (PROVENTIL HFA;VENTOLIN HFA) 108 (90 Base) MCG/ACT inhaler Inhale 2 puffs into the lungs every 6 (six) hours as needed for wheezing or  shortness of breath. 1 Inhaler 0  . Alcohol Swabs (ALCOHOL PADS) 70 % PADS Use to clean site to check blood sugars twice a day Dx E11.9 100 each 3  . Cholecalciferol (VITAMIN D3) 2000 units capsule Take 2,000 Units by mouth daily.    . Cinnamon 500 MG TABS Take 1 tablet (500 mg total) by mouth daily.    Marland Kitchen FLUoxetine (PROZAC) 20 MG capsule Take 1 capsule (20 mg total) by mouth daily. 90 capsule 1  . fluticasone furoate-vilanterol (BREO ELLIPTA) 200-25 MCG/INH AEPB Inhale 1 puff into the lungs daily. Rinse mouth well 60 each 12  . glucose blood (ONE TOUCH ULTRA TEST) test strip 1 each by Other route 2 (two) times daily. Use to check blood sugars twice a day  Dx E11.9 100 each 3  . hydroxychloroquine (PLAQUENIL) 200 MG tablet Take 0.5 tablets (100 mg total) by mouth daily.    . Lancets (ONETOUCH ULTRASOFT) lancets 1 each by Other route 2 (two) times daily. Use to help check blood sugars twice a day Dx E11.9 100 each 3  . Multiple Vitamins-Minerals (MULTIVITAMIN GUMMIES ADULT) CHEW Chew 1 tablet by mouth daily. Fiber well with B vitamins    . OVER THE COUNTER MEDICATION Apply 1 application topically daily. Neutrogena  Ultra Sheer sunblock , uses everyday due to her Lupus    . pantoprazole (PROTONIX) 40 MG tablet Take 1 tablet (40 mg total) by mouth 2  (two) times daily. 90 tablet 0   Current Facility-Administered Medications on File Prior to Visit  Medication Dose Route Frequency Provider Last Rate Last Dose  . aminophylline injection 11,635 mg  11,635 mg Intravenous Once Dorothy Spark, MD        Review of Systems:  As per HPI- otherwise negative.   Physical Examination: Vitals:   11/03/15 1255  BP: 121/60  Pulse: 89  Temp: 98.8 F (37.1 C)   Vitals:   11/03/15 1255  Weight: 195 lb 12.8 oz (88.8 kg)  Height: 5\' 6"  (1.676 m)   Body mass index is 31.6 kg/m. Ideal Body Weight: Weight in (lb) to have BMI = 25: 154.6  GEN: WDWN, NAD, Non-toxic, A & O x 3, overweight, looks well HEENT: Atraumatic, Normocephalic. Neck supple. No masses, No LAD.  Bilateral TM wnl but there is a clear effusion behind the left ear drum, oropharynx normal.  PEERL,EOMI.  Mild irritation of the left eye is apparent with injection No tooth tenderness Ears and Nose: No external deformity. CV: RRR, No M/G/R. No JVD. No thrill. No extra heart sounds. PULM: CTA B, no wheezes, crackles, rhonchi. No retractions. No resp. distress. No accessory muscle use. EXTR: No c/c/e NEURO Normal gait.  PSYCH: Normally interactive. Conversant. Not depressed or anxious appearing.  Calm demeanor.    Assessment and Plan: ETD (eustachian tube dysfunction), left  Middle ear effusion, left  Eye irritation  At this time no evidence of an ear infection. She will try afrin for her upcoming flight Dry eye gel as needed for her eye irritation She will contact me if she has any persistent ear problems  Signed Lamar Blinks, MD

## 2015-11-03 NOTE — Patient Instructions (Signed)
At this time I do not see any sign of an ear infection- however you do have clear fluid in the left ear. Afrin nasal spray and frequent swallowing (candy, gum, drinking a liquid) may be helpful when you fly. If this does not seem to be getting better or if you have any worsening of your symptoms or fever please let me know Try some Genteal dry eye gel for your eyes- again, let me know if not helpful Have a great trip to [place]

## 2015-11-18 ENCOUNTER — Other Ambulatory Visit: Payer: Self-pay

## 2015-11-18 ENCOUNTER — Ambulatory Visit: Payer: Self-pay | Admitting: Cardiology

## 2015-11-21 ENCOUNTER — Ambulatory Visit (INDEPENDENT_AMBULATORY_CARE_PROVIDER_SITE_OTHER): Payer: 59 | Admitting: Internal Medicine

## 2015-11-21 ENCOUNTER — Encounter: Payer: Self-pay | Admitting: Internal Medicine

## 2015-11-21 VITALS — BP 122/74 | HR 82 | Temp 98.6°F | Resp 16 | Ht 66.0 in | Wt 192.0 lb

## 2015-11-21 DIAGNOSIS — K219 Gastro-esophageal reflux disease without esophagitis: Secondary | ICD-10-CM

## 2015-11-21 DIAGNOSIS — E119 Type 2 diabetes mellitus without complications: Secondary | ICD-10-CM

## 2015-11-21 DIAGNOSIS — M797 Fibromyalgia: Secondary | ICD-10-CM | POA: Diagnosis not present

## 2015-11-21 DIAGNOSIS — M329 Systemic lupus erythematosus, unspecified: Secondary | ICD-10-CM | POA: Diagnosis not present

## 2015-11-21 DIAGNOSIS — M3500 Sicca syndrome, unspecified: Secondary | ICD-10-CM | POA: Diagnosis not present

## 2015-11-21 MED ORDER — ONETOUCH ULTRASOFT LANCETS MISC: 1.0000 | Freq: Two times a day (BID) | 3 refills | Status: AC

## 2015-11-21 MED ORDER — GLUCOSE BLOOD VI STRP: 1.0000 | ORAL_STRIP | Freq: Two times a day (BID) | 3 refills | Status: AC

## 2015-11-21 MED ORDER — METFORMIN HCL 500 MG PO TABS: 500.0000 mg | ORAL_TABLET | Freq: Two times a day (BID) | ORAL | 3 refills | Status: DC

## 2015-11-21 MED ORDER — PANTOPRAZOLE SODIUM 40 MG PO TBEC: 40.0000 mg | DELAYED_RELEASE_TABLET | Freq: Every day | ORAL | 3 refills | Status: AC

## 2015-11-21 MED FILL — metFORMIN HCL 500 MG TABS: 500 | 90 days supply | Qty: 180 | Fill #0

## 2015-11-21 MED FILL — PANTOPRAZOLE SOD DR 40 MG T: 40 | 90 days supply | Qty: 180 | Fill #0

## 2015-11-21 MED FILL — ONE TOUCH DELICA 33G LANCET: 90 days supply | Qty: 200 | Fill #0

## 2015-11-21 MED FILL — ONE TOUCH ULTRA TEST STRIPS: 90 days supply | Qty: 100 | Fill #1

## 2015-11-21 NOTE — Patient Instructions (Addendum)
  Test(s) ordered today. Your results will be released to [facility] (or called to you) after review, usually within 72hours after test completion. If any changes need to be made, you will be notified at that same time.   No immunizations administered today.   Medications reviewed and updated.  Changes include starting metformin 500 mg twice daily with food.   Your prescription(s) have been submitted to your pharmacy. Please take as directed and contact our office if you believe you are having problem(s) with the medication(s).   Please followup in about two months

## 2015-11-21 NOTE — Assessment & Plan Note (Addendum)
Has had eye exam  Sugars still high - start metformin 500 mg BID - will increase if she tolerates it. Discussed possible side effects and benefits of medicaton Exercising, working on weight loss Compliant with a diabetic diet Follow up in 2-3 months - will repeat blood work at that time

## 2015-11-21 NOTE — Progress Notes (Signed)
Pre visit review using our clinic review tool, if applicable. No additional management support is needed unless otherwise documented below in the visit note. 

## 2015-11-21 NOTE — Assessment & Plan Note (Signed)
GERD controlled Continue daily medication  

## 2015-11-21 NOTE — Progress Notes (Signed)
Subjective:    Patient ID: Allison Davies, female    DOB: [birth year], 45 y.o.   MRN: [id number]  HPI The patient is here for follow up.  Diabetes: She was diagnosed 08/2015.  She is taking her medication daily as prescribed. She is compliant with a diabetic diet. She is exercising regularly - she is using the elliptical. She monitors her sugars and they have been running 143 is the lowest regardless of what she eats or does.  The highest sugar she had was 324 after eating sweets.   GERD:  She is taking her medication daily as prescribed.  She denies any GERD symptoms and feels her GERD is well controlled.    Medications and allergies reviewed with patient and updated if appropriate.  Patient Active Problem List   Diagnosis Date Noted  . Abnormal nuclear stress test   . SLE (systemic lupus erythematosus) ([place]) 09/02/2015  . FH: CAD (coronary artery disease) 09/02/2015  . HLD (hyperlipidemia) 09/02/2015  . Diabetes mellitus with no complication ([place]) [id number]  . Elevated LFTs 09/02/2015  . Obese 08/20/2015  . Pain in the chest 08/20/2015  . Borderline glaucoma 08/20/2015  . Sjoegren syndrome ([place]) 08/19/2015  . Fibromyalgia 08/19/2015  . Asthma 08/19/2015  . GERD (gastroesophageal reflux disease) 08/19/2015  . Vitamin D deficiency 08/19/2015  . B12 deficiency 08/19/2015  . Obstructive sleep apnea 11/19/2014  . Systemic lupus erythematosus ([place]) 11/19/2014  . H/O oral aphthous ulcers 11/19/2014  . Dizziness and giddiness 01/08/2012  . Hematemesis 12/10/2011  . Edema 11/27/2011  . Cephalalgia 11/27/2011  . LBP (low back pain) 11/27/2011  . Fatty infiltration of liver 03/28/2011  . Anxiety 10/04/2009    Current Outpatient Prescriptions on File Prior to Visit  Medication Sig Dispense Refill  . albuterol (PROVENTIL HFA;VENTOLIN HFA) 108 (90 Base) MCG/ACT inhaler Inhale 2 puffs into the lungs every 6 (six) hours as needed for wheezing or shortness of breath. 1 Inhaler 0  .  Alcohol Swabs (ALCOHOL PADS) 70 % PADS Use to clean site to check blood sugars twice a day Dx E11.9 100 each 3  . carboxymethylcellulose (REFRESH PLUS) 0.5 % SOLN 1 drop 3 (three) times daily as needed.    . Cholecalciferol (VITAMIN D3) 2000 units capsule Take 2,000 Units by mouth daily.    Marland Kitchen FLUoxetine (PROZAC) 20 MG capsule Take 1 capsule (20 mg total) by mouth daily. 90 capsule 1  . fluticasone furoate-vilanterol (BREO ELLIPTA) 200-25 MCG/INH AEPB Inhale 1 puff into the lungs daily. Rinse mouth well 60 each 12  . glucose blood (ONE TOUCH ULTRA TEST) test strip 1 each by Other route 2 (two) times daily. Use to check blood sugars twice a day  Dx E11.9 100 each 3  . hydroxychloroquine (PLAQUENIL) 200 MG tablet Take 0.5 tablets (100 mg total) by mouth daily.    . Lancets (ONETOUCH ULTRASOFT) lancets 1 each by Other route 2 (two) times daily. Use to help check blood sugars twice a day Dx E11.9 100 each 3  . Multiple Vitamins-Minerals (MULTIVITAMIN GUMMIES ADULT) CHEW Chew 1 tablet by mouth daily. Fiber well with B vitamins    . pantoprazole (PROTONIX) 40 MG tablet Take 1 tablet (40 mg total) by mouth 2 (two) times daily. (Patient taking differently: Take 40 mg by mouth daily. ) 90 tablet 0   Current Facility-Administered Medications on File Prior to Visit  Medication Dose Route Frequency Provider Last Rate Last Dose  . aminophylline injection 11,635 mg  11,635 mg  Intravenous Once Dorothy Spark, MD        Past Medical History:  Diagnosis Date  . Anxiety    no meds, history of  . Asthma    rarely uses inhaler  . Blood glucose abnormal   . Bursitis of both hips 12/28/14  . Connective tissue disease ([place])    Lupus  . Depression     no meds, history of  . Fatty liver   . Fibromyalgia 12/28/14  . GERD (gastroesophageal reflux disease)   . Hiatal hernia   . Joint pain   . Lupus   . Morphea   . Muscle spasm of back   . Ocular hypertension   . Plantar fascia syndrome   . Sjogren's  syndrome ([place])   . Sleep apnea     Past Surgical History:  Procedure Laterality Date  . ABDOMINAL HYSTERECTOMY  2014  . APPENDECTOMY  1994  . CARDIAC CATHETERIZATION N/A 10/07/2015   Procedure: Left Heart Cath and Coronary Angiography;  Surgeon: Jettie Booze, MD;  Location: [facility];  Service: Cardiovascular;  Laterality: N/A;  . HYSTEROSCOPY W/D&C N/A 10/16/2012   Procedure: DILATATION AND CURETTAGE /HYSTEROSCOPY;  Surgeon: Marylynn Pearson, MD;  Location: [facility];  Service: Gynecology;  Laterality: N/A;  . LAPAROSCOPIC ASSISTED VAGINAL HYSTERECTOMY N/A 04/28/2013   Procedure: LAPAROSCOPIC ASSISTED VAGINAL HYSTERECTOMY;  Surgeon: Thurnell Lose, MD;  Location: [facility];  Service: Gynecology;  Laterality: N/A;  . SALPINGECTOMY     bi-lateral  . VULVA Milagros Loll BIOPSY N/A 04/28/2013   Procedure: VULVAR BIOPSY;  Surgeon: Thurnell Lose, MD;  Location: [facility];  Service: Gynecology;  Laterality: N/A;  . WISDOM TOOTH EXTRACTION      Social History   Social History  . Marital status: Married    Spouse name: N/A  . Number of children: 3  . Years of education: 14   Occupational History  . [employer]   Social History Main Topics  . Smoking status: Former Smoker    Packs/day: 2.00    Years: 20.00    Types: Cigarettes    Quit date: 03/01/2003  . Smokeless tobacco: Never Used  . Alcohol use No  . Drug use: No  . Sexual activity: Yes    Birth control/ protection: Surgical   Other Topics Concern  . Not on file   Social History Narrative   Lives at home with spouse   Caffeine use- 1 diet Dr Malachi Bonds daily, 8 oz    Family History  Problem Relation Age of Onset  . Diabetes Mother   . Heart disease Mother   . Depression Mother   . Diabetes Father   . Depression Father   . Diabetes Maternal Grandfather   . Heart disease Maternal Grandfather   . Diabetes Maternal Grandmother   . Heart disease Maternal Grandmother   . Heart disease Brother   .  Hypertension Brother   . Stroke Brother   . Colon cancer Neg Hx   . Suicidality Neg Hx     Review of Systems  Constitutional: Negative for fever.  Eyes: Negative for visual disturbance.  Respiratory: Negative for cough, shortness of breath and wheezing.   Cardiovascular: Negative for chest pain, palpitations and leg swelling.  Gastrointestinal:       No gerd  Endocrine: Positive for polydipsia and polyuria.  Neurological: Positive for headaches (occasional). Negative for dizziness and light-headedness.       Objective:   Vitals:  11/21/15 1327  BP: 122/74  Pulse: 82  Resp: 16  Temp: 98.6 F (37 C)   Filed Weights   11/21/15 1327  Weight: 192 lb (87.1 kg)   Body mass index is 30.99 kg/m.   Physical Exam    Constitutional: Appears well-developed and well-nourished. No distress.  HENT:  Head: Normocephalic and atraumatic.  Neck: Neck supple. No tracheal deviation present. No thyromegaly present.  No cervical lymphadenopathy Cardiovascular: Normal rate, regular rhythm and normal heart sounds.   No murmur heard. No carotid bruit .  No edema Pulmonary/Chest: Effort normal and breath sounds normal. No respiratory distress. No has no wheezes. No rales.  Skin: Skin is warm and dry. Not diaphoretic.  Psychiatric: Normal mood and affect. Behavior is normal.     Assessment & Plan:    See Problem List for Assessment and Plan of chronic medical problems.

## 2015-11-24 ENCOUNTER — Encounter: Payer: Self-pay | Admitting: Internal Medicine

## 2015-11-28 MED FILL — BREO ELLIPTA 200-25 MCG INH: 200-25 | 30 days supply | Qty: 60 | Fill #2

## 2015-11-30 ENCOUNTER — Encounter: Payer: Self-pay | Admitting: Internal Medicine

## 2015-11-30 ENCOUNTER — Other Ambulatory Visit: Payer: Self-pay | Admitting: Internal Medicine

## 2015-11-30 DIAGNOSIS — E785 Hyperlipidemia, unspecified: Secondary | ICD-10-CM

## 2015-11-30 DIAGNOSIS — E119 Type 2 diabetes mellitus without complications: Secondary | ICD-10-CM

## 2015-12-04 DIAGNOSIS — J069 Acute upper respiratory infection, unspecified: Secondary | ICD-10-CM | POA: Diagnosis not present

## 2015-12-05 ENCOUNTER — Encounter: Payer: Self-pay | Admitting: Internal Medicine

## 2015-12-05 ENCOUNTER — Encounter: Payer: Self-pay | Admitting: Cardiology

## 2015-12-05 ENCOUNTER — Telehealth: Payer: 59 | Admitting: Family

## 2015-12-05 DIAGNOSIS — J028 Acute pharyngitis due to other specified organisms: Secondary | ICD-10-CM

## 2015-12-05 DIAGNOSIS — B9689 Other specified bacterial agents as the cause of diseases classified elsewhere: Secondary | ICD-10-CM | POA: Diagnosis not present

## 2015-12-05 MED ORDER — AZITHROMYCIN 250 MG PO TABS: ORAL_TABLET | ORAL | 0 refills | Status: DC

## 2015-12-05 NOTE — Progress Notes (Signed)

## 2015-12-06 ENCOUNTER — Ambulatory Visit: Payer: Self-pay | Admitting: Internal Medicine

## 2015-12-07 ENCOUNTER — Encounter (HOSPITAL_COMMUNITY): Payer: Self-pay

## 2015-12-07 ENCOUNTER — Emergency Department (HOSPITAL_COMMUNITY): Payer: 59

## 2015-12-07 ENCOUNTER — Emergency Department (HOSPITAL_COMMUNITY)
Admission: EM | Admit: 2015-12-07 | Discharge: 2015-12-07 | Disposition: A | Discharge status: Home / Self Care | Payer: 59 | Attending: Emergency Medicine | Admitting: Emergency Medicine

## 2015-12-07 DIAGNOSIS — E119 Type 2 diabetes mellitus without complications: Secondary | ICD-10-CM | POA: Diagnosis not present

## 2015-12-07 DIAGNOSIS — R0602 Shortness of breath: Secondary | ICD-10-CM | POA: Diagnosis not present

## 2015-12-07 DIAGNOSIS — R05 Cough: Secondary | ICD-10-CM | POA: Diagnosis not present

## 2015-12-07 DIAGNOSIS — J45909 Unspecified asthma, uncomplicated: Secondary | ICD-10-CM | POA: Insufficient documentation

## 2015-12-07 DIAGNOSIS — Z87891 Personal history of nicotine dependence: Secondary | ICD-10-CM | POA: Diagnosis not present

## 2015-12-07 DIAGNOSIS — B349 Viral infection, unspecified: Secondary | ICD-10-CM | POA: Diagnosis not present

## 2015-12-07 DIAGNOSIS — Z79899 Other long term (current) drug therapy: Secondary | ICD-10-CM | POA: Insufficient documentation

## 2015-12-07 DIAGNOSIS — Z7984 Long term (current) use of oral hypoglycemic drugs: Secondary | ICD-10-CM | POA: Insufficient documentation

## 2015-12-07 LAB — URINALYSIS, ROUTINE W REFLEX MICROSCOPIC
Bilirubin Urine: NEGATIVE
GLUCOSE, UA: NEGATIVE mg/dL
Hgb urine dipstick: NEGATIVE
Ketones, ur: NEGATIVE mg/dL
LEUKOCYTES UA: NEGATIVE
Nitrite: NEGATIVE
PH: 5.5 (ref 5.0–8.0)
Protein, ur: NEGATIVE mg/dL
SPECIFIC GRAVITY, URINE: 1.022 (ref 1.005–1.030)

## 2015-12-07 LAB — COMPREHENSIVE METABOLIC PANEL
ALK PHOS: 100 U/L (ref 38–126)
ALT: 38 U/L (ref 14–54)
AST: 36 U/L (ref 15–41)
Albumin: 3.7 g/dL (ref 3.5–5.0)
Anion gap: 8 (ref 5–15)
BUN: 19 mg/dL (ref 6–20)
CALCIUM: 8.4 mg/dL — AB (ref 8.9–10.3)
CO2: 22 mmol/L (ref 22–32)
CREATININE: 0.89 mg/dL (ref 0.44–1.00)
Chloride: 107 mmol/L (ref 101–111)
Glucose, Bld: 251 mg/dL — ABNORMAL HIGH (ref 65–99)
Potassium: 3.5 mmol/L (ref 3.5–5.1)
SODIUM: 137 mmol/L (ref 135–145)
Total Bilirubin: 0.8 mg/dL (ref 0.3–1.2)
Total Protein: 6.6 g/dL (ref 6.5–8.1)

## 2015-12-07 LAB — CBC WITH DIFFERENTIAL/PLATELET
BASOS ABS: 0 10*3/uL (ref 0.0–0.1)
Basophils Relative: 0 %
Eosinophils Absolute: 0.2 10*3/uL (ref 0.0–0.7)
Eosinophils Relative: 2 %
HCT: 41 % (ref 36.0–46.0)
HEMOGLOBIN: 14 g/dL (ref 12.0–15.0)
LYMPHS ABS: 1.1 10*3/uL (ref 0.7–4.0)
LYMPHS PCT: 10 %
MCH: 30.6 pg (ref 26.0–34.0)
MCHC: 34.1 g/dL (ref 30.0–36.0)
MCV: 89.5 fL (ref 78.0–100.0)
Monocytes Absolute: 0.6 10*3/uL (ref 0.1–1.0)
Monocytes Relative: 5 %
NEUTROS PCT: 83 %
Neutro Abs: 9.1 10*3/uL — ABNORMAL HIGH (ref 1.7–7.7)
Platelets: 207 10*3/uL (ref 150–400)
RBC: 4.58 MIL/uL (ref 3.87–5.11)
RDW: 13.5 % (ref 11.5–15.5)
WBC: 10.9 10*3/uL — AB (ref 4.0–10.5)

## 2015-12-07 LAB — RAPID STREP SCREEN (MED CTR MEBANE ONLY): STREPTOCOCCUS, GROUP A SCREEN (DIRECT): NEGATIVE

## 2015-12-07 LAB — MONONUCLEOSIS SCREEN: Mono Screen: NEGATIVE

## 2015-12-07 MED ORDER — SODIUM CHLORIDE 0.9 % IV BOLUS (SEPSIS)
2000.0000 mL | Freq: Once | INTRAVENOUS | Status: AC
  Administered 2015-12-07: 1000 mL via INTRAVENOUS

## 2015-12-07 MED ORDER — SODIUM CHLORIDE 0.9 % IV SOLN
INTRAVENOUS | Status: DC
Start: 2015-12-07 — End: 2015-12-07
  Administered 2015-12-07: 13:00:00 via INTRAVENOUS

## 2015-12-07 MED ORDER — KETOROLAC TROMETHAMINE 30 MG/ML IJ SOLN
30.0000 mg | Freq: Once | INTRAMUSCULAR | Status: AC
Start: 2015-12-07 — End: 2015-12-07
  Administered 2015-12-07: 30 mg via INTRAVENOUS
  Filled 2015-12-07: qty 1

## 2015-12-07 NOTE — ED Provider Notes (Signed)
[facility] DEPT Provider Note   CSN: [id number] Arrival date & time: 12/07/15  0848     History   Chief Complaint Chief Complaint  Patient presents with  . Cough  . Fever    HPI Allison Davies is a 45 y.o. female.  45 year old female presents with cough chills and body aches and fever times several days. Seen in urgent care center had a negative flu test and was given a presumptive diagnosis of bronchitis. Placed on prednisone as well as given an inhaler. Continues to now fill worse with diffuse weakness along with urinary frequency. Cough is been nonproductive. She is concerned she may have developing pneumonia. Denies any headache. No visual changes. No vomiting or diarrhea but does endorse nausea. Denies any rashes. No abdominal or chest discomfort.      Past Medical History:  Diagnosis Date  . Anxiety    no meds, history of  . Asthma    rarely uses inhaler  . Blood glucose abnormal   . Bursitis of both hips 12/28/14  . Connective tissue disease ([place])    Lupus  . Depression     no meds, history of  . Fatty liver   . Fibromyalgia 12/28/14  . GERD (gastroesophageal reflux disease)   . Hiatal hernia   . Joint pain   . Lupus   . Morphea   . Muscle spasm of back   . Ocular hypertension   . Plantar fascia syndrome   . Sjogren's syndrome ([place])   . Sleep apnea     Patient Active Problem List   Diagnosis Date Noted  . Abnormal nuclear stress test   . SLE (systemic lupus erythematosus) ([place]) 09/02/2015  . FH: CAD (coronary artery disease) 09/02/2015  . HLD (hyperlipidemia) 09/02/2015  . Diabetes mellitus with no complication ([place]) [id number]  . Elevated LFTs 09/02/2015  . Obese 08/20/2015  . Pain in the chest 08/20/2015  . Borderline glaucoma 08/20/2015  . Sjoegren syndrome ([place]) 08/19/2015  . Fibromyalgia 08/19/2015  . Asthma 08/19/2015  . GERD (gastroesophageal reflux disease) 08/19/2015  . Vitamin D deficiency 08/19/2015  . B12 deficiency 08/19/2015   . Obstructive sleep apnea 11/19/2014  . Systemic lupus erythematosus ([place]) 11/19/2014  . H/O oral aphthous ulcers 11/19/2014  . Dizziness and giddiness 01/08/2012  . Hematemesis 12/10/2011  . Edema 11/27/2011  . Cephalalgia 11/27/2011  . LBP (low back pain) 11/27/2011  . Fatty infiltration of liver 03/28/2011  . Anxiety 10/04/2009    Past Surgical History:  Procedure Laterality Date  . ABDOMINAL HYSTERECTOMY  2014  . APPENDECTOMY  1994  . CARDIAC CATHETERIZATION N/A 10/07/2015   Procedure: Left Heart Cath and Coronary Angiography;  Surgeon: Jettie Booze, MD;  Location: [facility];  Service: Cardiovascular;  Laterality: N/A;  . HYSTEROSCOPY W/D&C N/A 10/16/2012   Procedure: DILATATION AND CURETTAGE /HYSTEROSCOPY;  Surgeon: Marylynn Pearson, MD;  Location: [facility];  Service: Gynecology;  Laterality: N/A;  . LAPAROSCOPIC ASSISTED VAGINAL HYSTERECTOMY N/A 04/28/2013   Procedure: LAPAROSCOPIC ASSISTED VAGINAL HYSTERECTOMY;  Surgeon: Thurnell Lose, MD;  Location: [facility];  Service: Gynecology;  Laterality: N/A;  . SALPINGECTOMY     bi-lateral  . VULVA Milagros Loll BIOPSY N/A 04/28/2013   Procedure: VULVAR BIOPSY;  Surgeon: Thurnell Lose, MD;  Location: [facility];  Service: Gynecology;  Laterality: N/A;  . WISDOM TOOTH EXTRACTION      OB History    No data available       Home Medications    Prior to  Admission medications   Medication Sig Start Date End Date Taking? Authorizing Provider  albuterol (PROVENTIL HFA;VENTOLIN HFA) 108 (90 Base) MCG/ACT inhaler Inhale 2 puffs into the lungs every 6 (six) hours as needed for wheezing or shortness of breath. 08/19/15   Binnie Rail, MD  Alcohol Swabs (ALCOHOL PADS) 70 % PADS Use to clean site to check blood sugars twice a day Dx E11.9 08/23/15   Binnie Rail, MD  azithromycin (ZITHROMAX) 250 MG tablet Take 2 tabs now then 1 daily times 4 days. 12/05/15   Benjamine Mola, FNP  carboxymethylcellulose (REFRESH PLUS) 0.5 % SOLN 1 drop 3  (three) times daily as needed.    Historical Provider, MD  Cholecalciferol (VITAMIN D3) 2000 units capsule Take 2,000 Units by mouth daily.    Historical Provider, MD  FLUoxetine (PROZAC) 20 MG capsule Take 1 capsule (20 mg total) by mouth daily. 08/19/15   Binnie Rail, MD  fluticasone furoate-vilanterol (BREO ELLIPTA) 200-25 MCG/INH AEPB Inhale 1 puff into the lungs daily. Rinse mouth well 09/14/15   Deneise Lever, MD  glucose blood (ONE TOUCH ULTRA TEST) test strip 1 each by Other route 2 (two) times daily. Use to check blood sugars twice a day Dx E11.9 11/21/15   Binnie Rail, MD  hydroxychloroquine (PLAQUENIL) 200 MG tablet Take 0.5 tablets (100 mg total) by mouth daily. 09/02/15   Dorothy Spark, MD  Lancets [facility] ULTRASOFT) lancets 1 each by Other route 2 (two) times daily. Use to help check blood sugars twice a day  Dx E11.9 11/21/15   Binnie Rail, MD  metFORMIN (GLUCOPHAGE) 500 MG tablet Take 1 tablet (500 mg total) by mouth 2 (two) times daily with a meal. 11/21/15   Binnie Rail, MD  Multiple Vitamins-Minerals (MULTIVITAMIN GUMMIES ADULT) CHEW Chew 1 tablet by mouth daily. Fiber well with B vitamins    Historical Provider, MD  pantoprazole (PROTONIX) 40 MG tablet Take 1 tablet (40 mg total) by mouth daily. 11/21/15   Binnie Rail, MD    Family History Family History  Problem Relation Age of Onset  . Diabetes Mother   . Heart disease Mother   . Depression Mother   . Diabetes Father   . Depression Father   . Diabetes Maternal Grandfather   . Heart disease Maternal Grandfather   . Diabetes Maternal Grandmother   . Heart disease Maternal Grandmother   . Heart disease Brother   . Hypertension Brother   . Stroke Brother   . Colon cancer Neg Hx   . Suicidality Neg Hx     Social History Social History  Substance Use Topics  . Smoking status: Former Smoker    Packs/day: 2.00    Years: 20.00    Types: Cigarettes    Quit date: 03/01/2003  . Smokeless tobacco: Never Used    . Alcohol use No     Allergies   Lemon extract [flavoring agent]; Prochlorperazine; Prochlorperazine edisylate; Codeine; Methotrexate derivatives; Tramadol; Ativan [lorazepam]; Hydrocodone; Isometheptene-dichloral-apap; Klonopin [clonazepam]; Moxifloxacin; Penicillins; Plaquenil [hydroxychloroquine]; Promethazine; and Xanax [alprazolam]   Review of Systems Review of Systems  All other systems reviewed and are negative.    Physical Exam Updated Vital Signs BP 134/89 (BP Location: Left Arm)   Pulse 97   Temp 97.7 F (36.5 C) (Oral)   Resp 18   LMP 01/20/2013   SpO2 98%   Physical Exam  Constitutional: She is oriented to person, place, and time. She appears well-developed and  well-nourished.  Non-toxic appearance. No distress.  HENT:  Head: Normocephalic and atraumatic.  Eyes: Conjunctivae, EOM and lids are normal. Pupils are equal, round, and reactive to light.  Neck: Normal range of motion. Neck supple. No tracheal deviation present. No thyroid mass present.  Cardiovascular: Normal rate, regular rhythm and normal heart sounds.  Exam reveals no gallop.   No murmur heard. Pulmonary/Chest: Effort normal and breath sounds normal. No stridor. No respiratory distress. She has no decreased breath sounds. She has no wheezes. She has no rhonchi. She has no rales.  Abdominal: Soft. Normal appearance and bowel sounds are normal. She exhibits no distension. There is no tenderness. There is no rebound and no CVA tenderness.  Musculoskeletal: Normal range of motion. She exhibits no edema or tenderness.  Neurological: She is alert and oriented to person, place, and time. She has normal strength. No cranial nerve deficit or sensory deficit. GCS eye subscore is 4. GCS verbal subscore is 5. GCS motor subscore is 6.  Skin: Skin is warm and dry. No abrasion and no rash noted.  Psychiatric: She has a normal mood and affect. Her speech is normal and behavior is normal.  Nursing note and vitals  reviewed.    ED Treatments / Results  Labs (all labs ordered are listed, but only abnormal results are displayed) Labs Reviewed  URINE CULTURE  RAPID STREP SCREEN (NOT AT [facility])  URINALYSIS, [place] (NOT AT [facility])  MONONUCLEOSIS SCREEN    EKG  EKG Interpretation None       Radiology No results found.  Procedures Procedures (including critical care time)  Medications Ordered in ED Medications - No data to display   Initial Impression / Assessment and Plan / ED Course  I have reviewed the triage vital signs and the nursing notes.  Pertinent labs & imaging results that were available during my care of the patient were reviewed by me and considered in my medical decision making (see chart for details).  Clinical Course    Patient given IV fluids and Toradol here feels better. Patient had a negative strep as well as Monospot. Chest x-ray did not show any acute findings. Urinalysis also is negative. Patient likely viral illness versus lupus exacerbation and will follow-up with her rheumatologist  Final Clinical Impressions(s) / ED Diagnoses   Final diagnoses:  SOB (shortness of breath)    New Prescriptions New Prescriptions   No medications on file     Lacretia Leigh, MD 12/07/15 1305

## 2015-12-07 NOTE — ED Triage Notes (Addendum)
Pt here with cough, chills, body aches and fever. Went to urgent care and tx for URI.  Pt not getting better and having to miss work.  Temp normal in triage.  Pt then states fever normal in last week.

## 2015-12-07 NOTE — Discharge Instructions (Signed)
Take ibuprofen with caution as directed. Call your Dr. today to talk about starting a new prednisone taper.

## 2015-12-08 LAB — URINE CULTURE

## 2015-12-10 LAB — CULTURE, GROUP A STREP (THRC)

## 2015-12-21 ENCOUNTER — Encounter: Payer: Self-pay | Admitting: Internal Medicine

## 2015-12-21 DIAGNOSIS — R5383 Other fatigue: Secondary | ICD-10-CM

## 2015-12-21 DIAGNOSIS — R200 Anesthesia of skin: Secondary | ICD-10-CM

## 2015-12-21 DIAGNOSIS — R202 Paresthesia of skin: Secondary | ICD-10-CM

## 2015-12-22 NOTE — Patient Instructions (Addendum)
  Test(s) ordered today. Your results will be released to MyChart (or called to you) after review, usually within 72hours after test completion. If any changes need to be made, you will be notified at that same time.    Medications reviewed and updated.  No changes recommended at this time.     

## 2015-12-22 NOTE — Progress Notes (Signed)
Subjective:    Patient ID: Allison Davies, female    DOB: [birth year], 45 y.o.   MRN: [id number]  HPI The patient is here for follow up.  Diabetes: She is taking her medication daily as prescribed. She is compliant with a diabetic diet. She is not exercising regularly due to not feeling well. She monitors her sugars and they have been running less than 130. She checks her feet daily and denies foot lesions. She is up-to-date with an ophthalmology examination.   GERD:  She is taking her medication daily as prescribed.  She denies any GERD symptoms and feels her GERD is well controlled.   Anxiety: She is taking her medication daily as prescribed. She denies any side effects from the medication. She feels her anxiety is well controlled and she is happy with her current dose of medication.  She is just exhausted.   She got a virus one month ago- after going to her granddaughters bday party.  Her and her husband both got sick.  She felt flu like.  She was tested for the flu and it was neg but was diagnosed with a viral illness. She has been out of work.  Her symptoms are better, but she still has some symptoms.  She ended up in the ED for dehydration.  She was told that her lupus and sjogrens was flared up.  Dr Amil Amen put her on short term disability.  She is still out of work.  She was placed on high dose prednisone taper, which she finished one week ago.  The prednisone helped but her symptoms are worse again.  She still feels fatigued, tingling intermittently throughout her body, especially her hips down, muscle spasms and soreness throughout her body.  She can not even walk through the grocery store due to extremely weakness - feeling like she is going to collapse.  Taking a showed causes exhaustion.   Left ear pain;  Her left ear feels swollen and hurts.  She has a nerve pain just above the ear.  Theses symptoms started this morning.  She has a difffuse slightly achy headache.  The left posterior  head hurts.   She has tingling throughout her body.  Dr Amil Amen told her that was not likely from her autoimmune issues. She has not contacted Dr Amil Amen since stopping the prednisone.   Medications and allergies reviewed with patient and updated if appropriate.  Patient Active Problem List   Diagnosis Date Noted  . Abnormal nuclear stress test   . SLE (systemic lupus erythematosus) ([place]) 09/02/2015  . FH: CAD (coronary artery disease) 09/02/2015  . HLD (hyperlipidemia) 09/02/2015  . Diabetes mellitus with no complication ([place]) [id number]  . Elevated LFTs 09/02/2015  . Obese 08/20/2015  . Pain in the chest 08/20/2015  . Borderline glaucoma 08/20/2015  . Sjoegren syndrome ([place]) 08/19/2015  . Fibromyalgia 08/19/2015  . Asthma 08/19/2015  . GERD (gastroesophageal reflux disease) 08/19/2015  . Vitamin D deficiency 08/19/2015  . B12 deficiency 08/19/2015  . Obstructive sleep apnea 11/19/2014  . Systemic lupus erythematosus ([place]) 11/19/2014  . H/O oral aphthous ulcers 11/19/2014  . Dizziness and giddiness 01/08/2012  . Hematemesis 12/10/2011  . Edema 11/27/2011  . Cephalalgia 11/27/2011  . LBP (low back pain) 11/27/2011  . Fatty infiltration of liver 03/28/2011  . Anxiety 10/04/2009    Current Outpatient Prescriptions on File Prior to Visit  Medication Sig Dispense Refill  . albuterol (PROVENTIL HFA;VENTOLIN HFA) 108 (90 Base) MCG/ACT inhaler Inhale 2  puffs into the lungs every 6 (six) hours as needed for wheezing or shortness of breath. 1 Inhaler 0  . Alcohol Swabs (ALCOHOL PADS) 70 % PADS Use to clean site to check blood sugars twice a day Dx E11.9 100 each 3  . carboxymethylcellulose (REFRESH PLUS) 0.5 % SOLN 1 drop 3 (three) times daily as needed.    . Cholecalciferol (VITAMIN D3) 2000 units capsule Take 2,000 Units by mouth daily.    Marland Kitchen FLUoxetine (PROZAC) 20 MG capsule Take 1 capsule (20 mg total) by mouth daily. 90 capsule 1  . fluticasone furoate-vilanterol (BREO ELLIPTA)  200-25 MCG/INH AEPB Inhale 1 puff into the lungs daily. Rinse mouth well 60 each 12  . glucose blood (ONE TOUCH ULTRA TEST) test strip 1 each by Other route 2 (two) times daily. Use to check blood sugars twice a day Dx E11.9 180 each 3  . hydroxychloroquine (PLAQUENIL) 200 MG tablet Take 0.5 tablets (100 mg total) by mouth daily.    Marland Kitchen ibuprofen (ADVIL,MOTRIN) 200 MG tablet Take 200 mg by mouth every 6 (six) hours as needed for moderate pain.    Marland Kitchen ipratropium (ATROVENT) 0.06 % nasal spray Place 1 spray into the nose daily.    . Lancets (ONETOUCH ULTRASOFT) lancets 1 each by Other route 2 (two) times daily. Use to help check blood sugars twice a day  Dx E11.9 180 each 3  . metFORMIN (GLUCOPHAGE) 500 MG tablet Take 1 tablet (500 mg total) by mouth 2 (two) times daily with a meal. 180 tablet 3  . Multiple Vitamins-Minerals (MULTIVITAMIN GUMMIES ADULT) CHEW Chew 1 tablet by mouth daily. Fiber well with B vitamins    . pantoprazole (PROTONIX) 40 MG tablet Take 1 tablet (40 mg total) by mouth daily. 90 tablet 3   Current Facility-Administered Medications on File Prior to Visit  Medication Dose Route Frequency Provider Last Rate Last Dose  . aminophylline injection 11,635 mg  11,635 mg Intravenous Once Dorothy Spark, MD        Past Medical History:  Diagnosis Date  . Anxiety    no meds, history of  . Asthma    rarely uses inhaler  . Blood glucose abnormal   . Bursitis of both hips 12/28/14  . Connective tissue disease ([place])    Lupus  . Depression     no meds, history of  . Fatty liver   . Fibromyalgia 12/28/14  . GERD (gastroesophageal reflux disease)   . Hiatal hernia   . Joint pain   . Lupus   . Morphea   . Muscle spasm of back   . Ocular hypertension   . Plantar fascia syndrome   . Sjogren's syndrome ([place])   . Sleep apnea     Past Surgical History:  Procedure Laterality Date  . ABDOMINAL HYSTERECTOMY  2014  . APPENDECTOMY  1994  . CARDIAC CATHETERIZATION N/A 10/07/2015    Procedure: Left Heart Cath and Coronary Angiography;  Surgeon: Jettie Booze, MD;  Location: [facility];  Service: Cardiovascular;  Laterality: N/A;  . HYSTEROSCOPY W/D&C N/A 10/16/2012   Procedure: DILATATION AND CURETTAGE /HYSTEROSCOPY;  Surgeon: Marylynn Pearson, MD;  Location: [facility];  Service: Gynecology;  Laterality: N/A;  . LAPAROSCOPIC ASSISTED VAGINAL HYSTERECTOMY N/A 04/28/2013   Procedure: LAPAROSCOPIC ASSISTED VAGINAL HYSTERECTOMY;  Surgeon: Thurnell Lose, MD;  Location: [facility];  Service: Gynecology;  Laterality: N/A;  . SALPINGECTOMY     bi-lateral  . VULVA /PERINEUM BIOPSY N/A 04/28/2013   Procedure:  VULVAR BIOPSY;  Surgeon: Thurnell Lose, MD;  Location: [facility];  Service: Gynecology;  Laterality: N/A;  . WISDOM TOOTH EXTRACTION      Social History   Social History  . Marital status: Married    Spouse name: N/A  . Number of children: 3  . Years of education: 14   Occupational History  . [employer]   Social History Main Topics  . Smoking status: Former Smoker    Packs/day: 2.00    Years: 20.00    Types: Cigarettes    Quit date: 03/01/2003  . Smokeless tobacco: Never Used  . Alcohol use No  . Drug use: No  . Sexual activity: Yes    Birth control/ protection: Surgical   Other Topics Concern  . Not on file   Social History Narrative   Lives at home with spouse   Caffeine use- 1 diet Dr Malachi Bonds daily, 8 oz    Family History  Problem Relation Age of Onset  . Diabetes Mother   . Heart disease Mother   . Depression Mother   . Diabetes Father   . Depression Father   . Diabetes Maternal Grandfather   . Heart disease Maternal Grandfather   . Diabetes Maternal Grandmother   . Heart disease Maternal Grandmother   . Heart disease Brother   . Hypertension Brother   . Stroke Brother   . Colon cancer Neg Hx   . Suicidality Neg Hx     Review of Systems  Constitutional: Positive for appetite change (decreased), fatigue and fever  (intermittent, low grade).  HENT: Positive for ear pain (left ). Negative for congestion, sinus pain, sinus pressure and sore throat.   Eyes: Negative for visual disturbance.  Respiratory: Positive for cough, chest tightness and shortness of breath. Negative for wheezing.   Cardiovascular: Positive for chest pain (gerd related), palpitations and leg swelling.  Gastrointestinal: Positive for abdominal pain and nausea. Negative for blood in stool, constipation, diarrhea and vomiting.       Gerd occ  Genitourinary: Negative for dysuria and hematuria.  Musculoskeletal: Positive for arthralgias and myalgias.  Skin: Negative for rash.  Neurological: Positive for dizziness, light-headedness and headaches. Negative for numbness (tingling throughout body).       Objective:   Vitals:   12/23/15 1056  BP: 126/74  Pulse: 86  Resp: 18  Temp: 98.3 F (36.8 C)   Filed Weights   12/23/15 1056  Weight: 193 lb (87.5 kg)   Body mass index is 32.12 kg/m.   Physical Exam    Constitutional: Appears fatigued. No distress.  HENT:  Head: Normocephalic and atraumatic.  Neck: left temple tender to touch, no visible artery, normal ear canals and TM b/l, Neck supple. No tracheal deviation present. No thyromegaly present.  No cervical lymphadenopathy Cardiovascular: Normal rate, regular rhythm and normal heart sounds.   No murmur heard. No carotid bruit .  No edema Pulmonary/Chest: Effort normal and breath sounds normal. No respiratory distress. No has no wheezes. No rales.  Abdomen: soft, non-tender, non-distended Msk: muscles tender to touch Skin: Skin is warm and dry. Not diaphoretic.  Psychiatric: depressed mood and affect. Behavior is normal.      Assessment & Plan:    See Problem List for Assessment and Plan of chronic medical problems.

## 2015-12-23 ENCOUNTER — Other Ambulatory Visit (INDEPENDENT_AMBULATORY_CARE_PROVIDER_SITE_OTHER): Payer: 59

## 2015-12-23 ENCOUNTER — Ambulatory Visit (INDEPENDENT_AMBULATORY_CARE_PROVIDER_SITE_OTHER): Payer: 59 | Admitting: Internal Medicine

## 2015-12-23 ENCOUNTER — Other Ambulatory Visit: Payer: Self-pay

## 2015-12-23 ENCOUNTER — Encounter: Payer: Self-pay | Admitting: Internal Medicine

## 2015-12-23 VITALS — BP 126/74 | HR 86 | Temp 98.3°F | Resp 18 | Ht 65.0 in | Wt 193.0 lb

## 2015-12-23 DIAGNOSIS — R519 Headache, unspecified: Secondary | ICD-10-CM

## 2015-12-23 DIAGNOSIS — R5383 Other fatigue: Secondary | ICD-10-CM | POA: Diagnosis not present

## 2015-12-23 DIAGNOSIS — E119 Type 2 diabetes mellitus without complications: Secondary | ICD-10-CM

## 2015-12-23 DIAGNOSIS — R7989 Other specified abnormal findings of blood chemistry: Secondary | ICD-10-CM

## 2015-12-23 DIAGNOSIS — R202 Paresthesia of skin: Secondary | ICD-10-CM

## 2015-12-23 DIAGNOSIS — R51 Headache: Secondary | ICD-10-CM

## 2015-12-23 DIAGNOSIS — K219 Gastro-esophageal reflux disease without esophagitis: Secondary | ICD-10-CM

## 2015-12-23 DIAGNOSIS — E785 Hyperlipidemia, unspecified: Secondary | ICD-10-CM | POA: Diagnosis not present

## 2015-12-23 DIAGNOSIS — R200 Anesthesia of skin: Secondary | ICD-10-CM

## 2015-12-23 DIAGNOSIS — F419 Anxiety disorder, unspecified: Secondary | ICD-10-CM

## 2015-12-23 LAB — COMPREHENSIVE METABOLIC PANEL
ALK PHOS: 87 U/L (ref 39–117)
ALT: 31 U/L (ref 0–35)
AST: 23 U/L (ref 0–37)
Albumin: 3.9 g/dL (ref 3.5–5.2)
BUN: 12 mg/dL (ref 6–23)
CHLORIDE: 106 meq/L (ref 96–112)
CO2: 24 meq/L (ref 19–32)
Calcium: 9.1 mg/dL (ref 8.4–10.5)
Creatinine, Ser: 0.78 mg/dL (ref 0.40–1.20)
GFR: 84.34 mL/min (ref >60.00)
GLUCOSE: 114 mg/dL — AB (ref 70–99)
POTASSIUM: 4.2 meq/L (ref 3.5–5.1)
SODIUM: 138 meq/L (ref 135–145)
TOTAL PROTEIN: 6.8 g/dL (ref 6.0–8.3)
Total Bilirubin: 0.5 mg/dL (ref 0.2–1.2)

## 2015-12-23 LAB — CBC WITH DIFFERENTIAL/PLATELET
Basophils Absolute: 0 10*3/uL (ref 0.0–0.1)
Basophils Relative: 0.5 % (ref 0.0–3.0)
EOS ABS: 0.2 10*3/uL (ref 0.0–0.7)
EOS PCT: 2.4 % (ref 0.0–5.0)
HCT: 40 % (ref 36.0–46.0)
Hemoglobin: 13.6 g/dL (ref 12.0–15.0)
LYMPHS ABS: 1.7 10*3/uL (ref 0.7–4.0)
Lymphocytes Relative: 24.9 % (ref 12.0–46.0)
MCHC: 33.9 g/dL (ref 30.0–36.0)
MCV: 90.6 fl (ref 78.0–100.0)
MONO ABS: 0.4 10*3/uL (ref 0.1–1.0)
Monocytes Relative: 5.6 % (ref 3.0–12.0)
NEUTROS PCT: 66.6 % (ref 43.0–77.0)
Neutro Abs: 4.6 10*3/uL (ref 1.4–7.7)
Platelets: 199 10*3/uL (ref 150.0–400.0)
RBC: 4.42 Mil/uL (ref 3.87–5.11)
RDW: 13.9 % (ref 11.5–15.5)
WBC: 6.9 10*3/uL (ref 4.0–10.5)

## 2015-12-23 LAB — LIPID PANEL
Cholesterol: 161 mg/dL (ref 0–200)
HDL: 31.6 mg/dL — AB (ref >39.00)
NONHDL: 129.12
TRIGLYCERIDES: 372 mg/dL — AB (ref 0.0–149.0)
Total CHOL/HDL Ratio: 5
VLDL: 74.4 mg/dL — AB (ref 0.0–40.0)

## 2015-12-23 LAB — LDL CHOLESTEROL, DIRECT: Direct LDL: 73 mg/dL

## 2015-12-23 LAB — SEDIMENTATION RATE: SED RATE: 15 mm/h (ref 0–20)

## 2015-12-23 LAB — MICROALBUMIN / CREATININE URINE RATIO
CREATININE, U: 193.8 mg/dL
MICROALB UR: 1.5 mg/dL (ref 0.0–1.9)
MICROALB/CREAT RATIO: 0.8 mg/g (ref 0.0–30.0)

## 2015-12-23 LAB — VITAMIN B12: VITAMIN B 12: 291 pg/mL (ref 211–911)

## 2015-12-23 LAB — HEMOGLOBIN A1C: Hgb A1c MFr Bld: 7.4 % — ABNORMAL HIGH (ref 4.6–6.5)

## 2015-12-23 LAB — TSH: TSH: 2.6 u[IU]/mL (ref 0.35–4.50)

## 2015-12-23 NOTE — Assessment & Plan Note (Signed)
GERD controlled Continue daily medication  

## 2015-12-23 NOTE — Progress Notes (Signed)
Pre visit review using our clinic review tool, if applicable. No additional management support is needed unless otherwise documented below in the visit note. 

## 2015-12-23 NOTE — Assessment & Plan Note (Signed)
Check a1c Sugars controlled at home - were not affected by prednisone

## 2015-12-23 NOTE — Assessment & Plan Note (Signed)
Controlled, stable Continue current dose of medication  

## 2015-12-23 NOTE — Assessment & Plan Note (Signed)
Extreme Likely related to her autoimmune flare Will follow up with Dr Amil Amen - may need additional steroids Basic lab work pending.

## 2015-12-23 NOTE — Assessment & Plan Note (Signed)
Localized headache to left temple - tender, no visible inflamed vessel ? Temporal arteritis Will add esr to blood work Will call and f/u with Dr Amil Amen today

## 2015-12-24 ENCOUNTER — Encounter: Payer: Self-pay | Admitting: Internal Medicine

## 2015-12-26 DIAGNOSIS — M797 Fibromyalgia: Secondary | ICD-10-CM | POA: Diagnosis not present

## 2015-12-26 DIAGNOSIS — M329 Systemic lupus erythematosus, unspecified: Secondary | ICD-10-CM | POA: Diagnosis not present

## 2015-12-26 DIAGNOSIS — M3500 Sicca syndrome, unspecified: Secondary | ICD-10-CM | POA: Diagnosis not present

## 2016-01-18 DIAGNOSIS — G4733 Obstructive sleep apnea (adult) (pediatric): Secondary | ICD-10-CM | POA: Diagnosis not present

## 2016-02-09 ENCOUNTER — Encounter: Payer: Self-pay | Admitting: Internal Medicine

## 2016-02-09 DIAGNOSIS — E669 Obesity, unspecified: Secondary | ICD-10-CM | POA: Diagnosis not present

## 2016-02-09 DIAGNOSIS — M3500 Sicca syndrome, unspecified: Secondary | ICD-10-CM | POA: Diagnosis not present

## 2016-02-09 DIAGNOSIS — M329 Systemic lupus erythematosus, unspecified: Secondary | ICD-10-CM | POA: Diagnosis not present

## 2016-02-09 DIAGNOSIS — Z6831 Body mass index (BMI) 31.0-31.9, adult: Secondary | ICD-10-CM | POA: Diagnosis not present

## 2016-02-09 DIAGNOSIS — M797 Fibromyalgia: Secondary | ICD-10-CM | POA: Diagnosis not present

## 2016-02-16 ENCOUNTER — Encounter: Payer: Self-pay | Admitting: Internal Medicine

## 2016-02-16 ENCOUNTER — Other Ambulatory Visit: Payer: Self-pay | Admitting: Emergency Medicine

## 2016-02-16 DIAGNOSIS — F4322 Adjustment disorder with anxiety: Secondary | ICD-10-CM

## 2016-02-16 MED ORDER — FLUOXETINE HCL 20 MG PO CAPS: 20.0000 mg | ORAL_CAPSULE | Freq: Every day | ORAL | 1 refills | Status: AC

## 2016-02-16 MED ORDER — METFORMIN HCL 500 MG PO TABS: 500.0000 mg | ORAL_TABLET | Freq: Two times a day (BID) | ORAL | 1 refills | Status: AC

## 2016-02-16 NOTE — Telephone Encounter (Signed)
Refills have been sent. Please advise on blood sugars. Spoke to pt and she states her blood sugar was in the 260s this morning.

## 2016-02-17 ENCOUNTER — Encounter: Payer: Self-pay | Admitting: Internal Medicine

## 2016-02-17 NOTE — Telephone Encounter (Signed)
Call her - have her increase metformin to 1000 mg twice daily.  Also lets add a medication once a day for now until her sugars start to come down and then she can stop it.  Glipizide 5 mg daily - please send to pharmacy

## 2016-03-09 ENCOUNTER — Other Ambulatory Visit: Payer: Self-pay | Admitting: Internal Medicine

## 2016-03-09 DIAGNOSIS — F4322 Adjustment disorder with anxiety: Secondary | ICD-10-CM

## 2017-05-08 IMAGING — CR DG CHEST 2V
2 series · 2 of 2 positions shown · non-contrast
Comparison: 08/23/2015.

CLINICAL DATA: Cough, congestion, symptoms for 2 weeks.

EXAM:
CHEST  2 VIEW

[w chest pa]
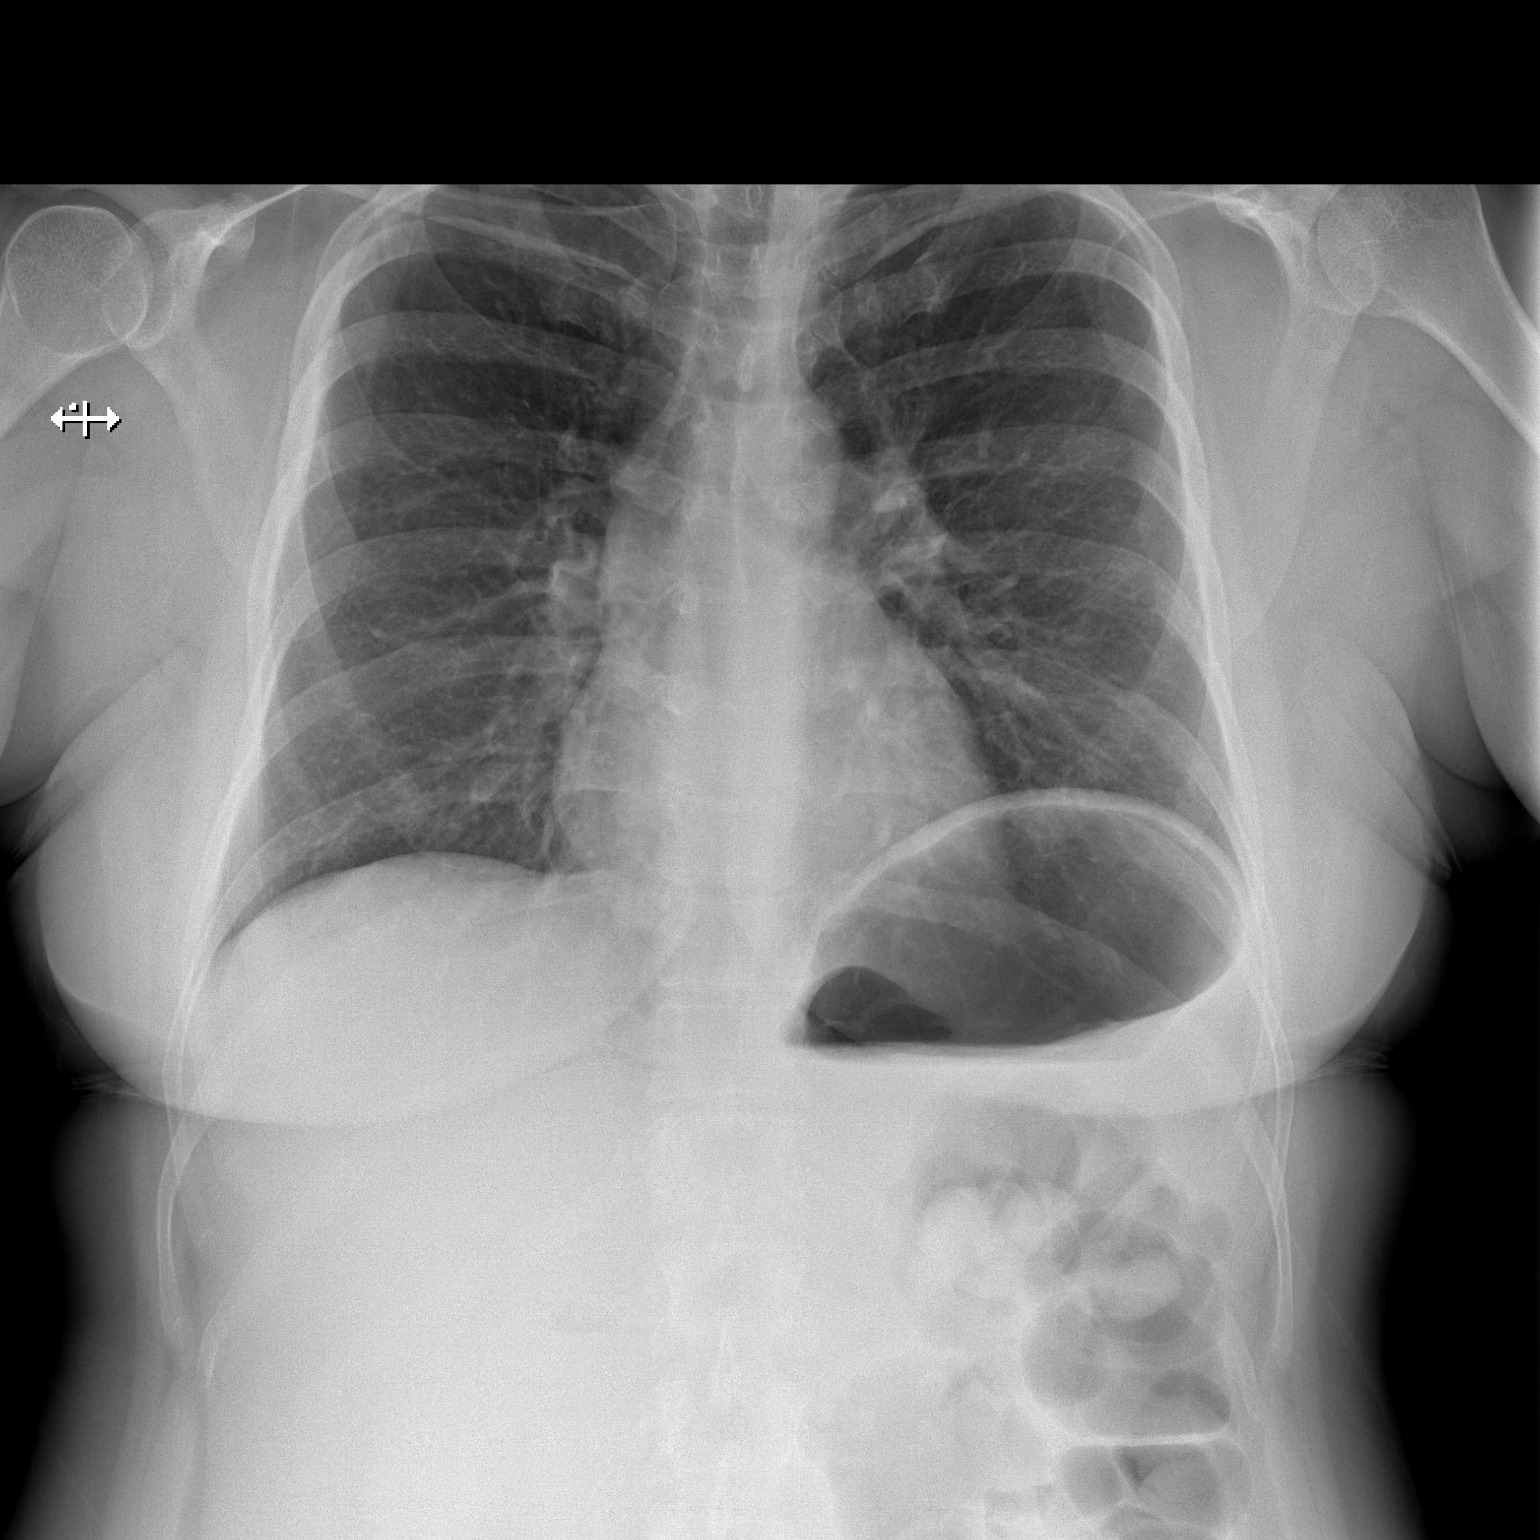

[w chest lat]
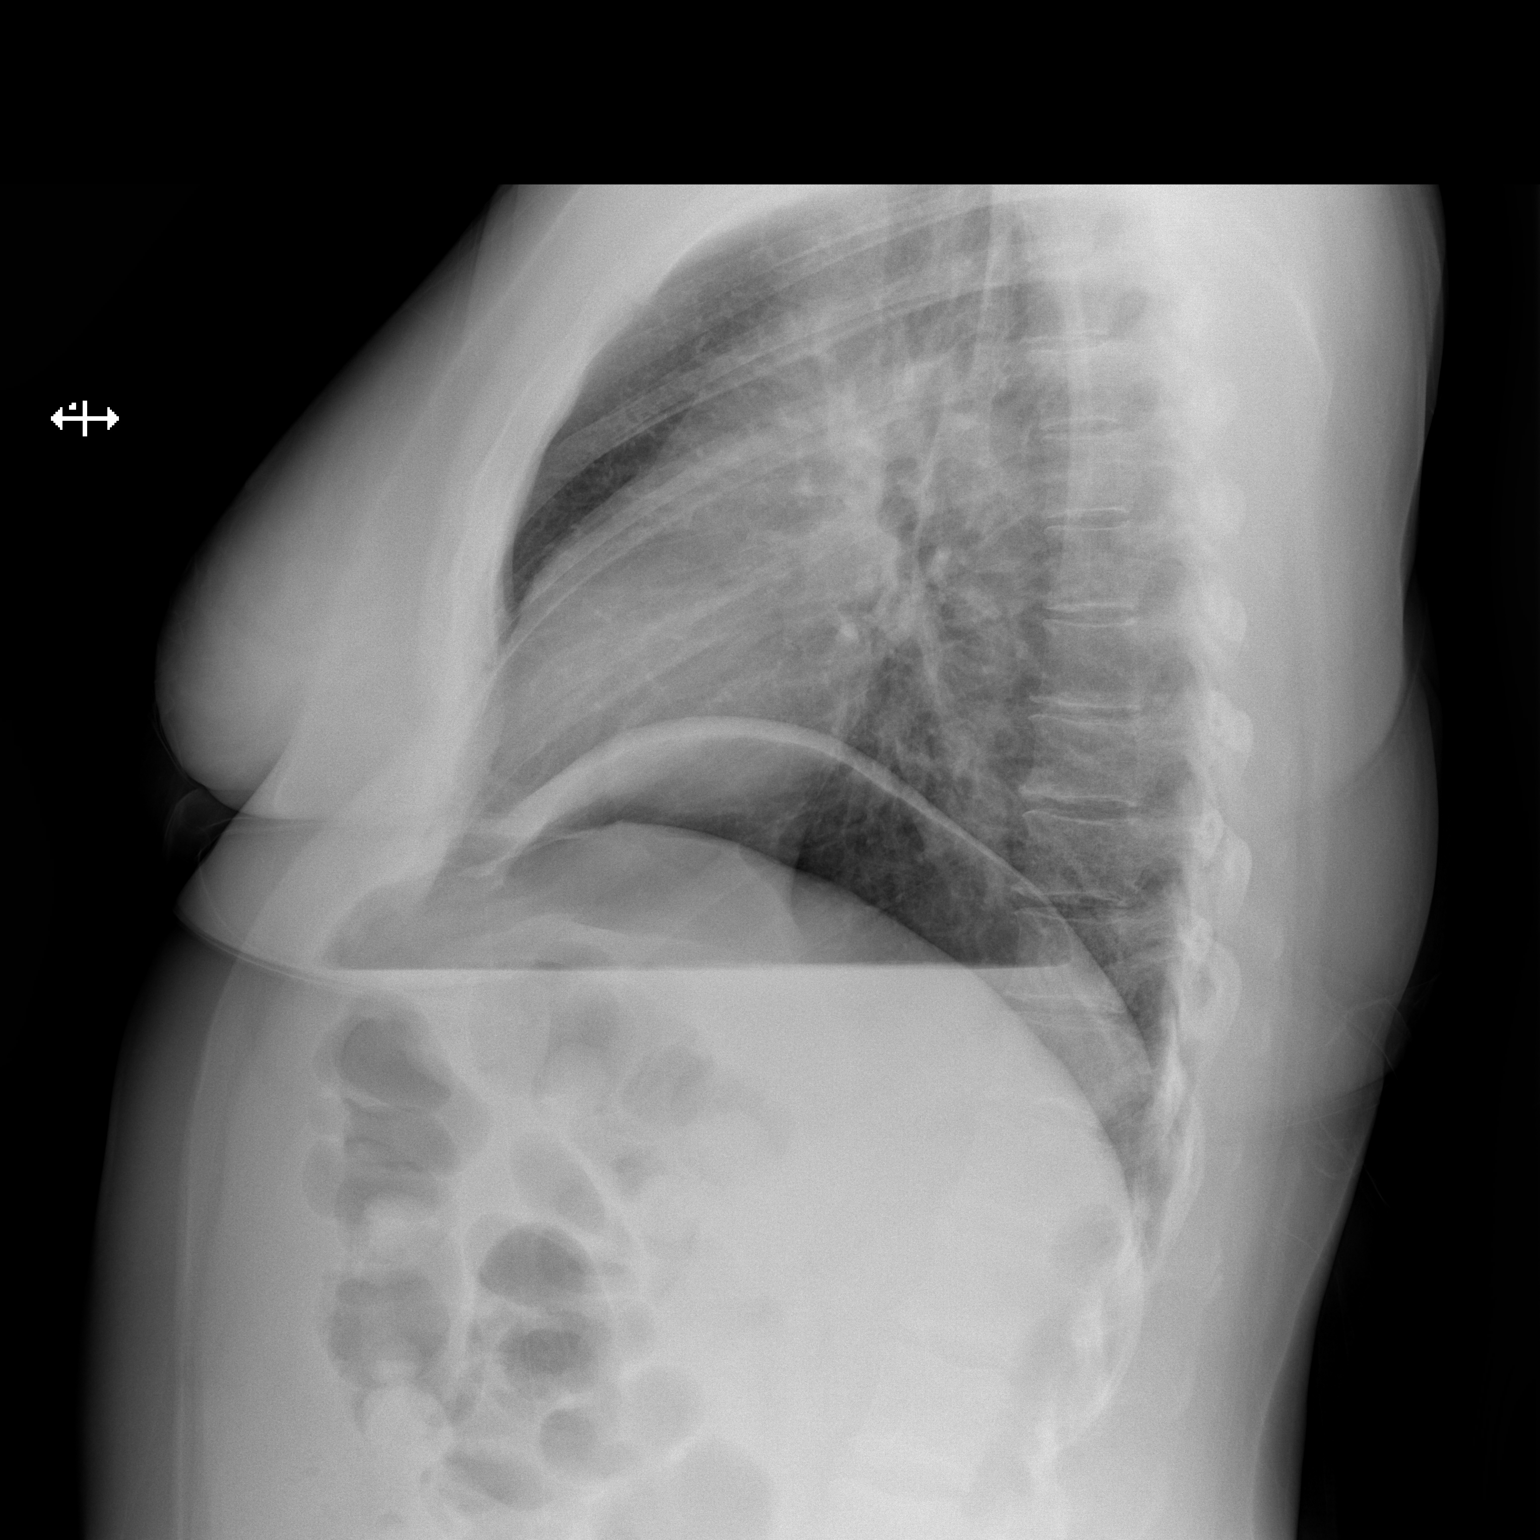

[2 of 2 positions shown; findings below may reference images not displayed]

FINDINGS: Trachea is midline. Heart size normal. Lungs are clear. No pleural
fluid. Left hemidiaphragm is slightly elevated.
IMPRESSION: No acute findings.
